# Patient Record
Sex: Male | Born: 1955
Health system: Southern US, Community
[De-identification: ages and names within clinical notes are randomized; demographics above are authoritative.]

## PROBLEM LIST (undated history)

## (undated) DIAGNOSIS — E7800 Pure hypercholesterolemia, unspecified: Secondary | ICD-10-CM

## (undated) DIAGNOSIS — G629 Polyneuropathy, unspecified: Secondary | ICD-10-CM

## (undated) DIAGNOSIS — N400 Enlarged prostate without lower urinary tract symptoms: Secondary | ICD-10-CM

## (undated) DIAGNOSIS — F419 Anxiety disorder, unspecified: Secondary | ICD-10-CM

## (undated) DIAGNOSIS — K219 Gastro-esophageal reflux disease without esophagitis: Secondary | ICD-10-CM

## (undated) DIAGNOSIS — N200 Calculus of kidney: Secondary | ICD-10-CM

## (undated) DIAGNOSIS — R7303 Prediabetes: Secondary | ICD-10-CM

## (undated) DIAGNOSIS — M47812 Spondylosis without myelopathy or radiculopathy, cervical region: Secondary | ICD-10-CM

## (undated) DIAGNOSIS — N529 Male erectile dysfunction, unspecified: Secondary | ICD-10-CM

## (undated) HISTORY — DX: Calculus of kidney: N20.0

## (undated) HISTORY — DX: Benign prostatic hyperplasia without lower urinary tract symptoms: N40.0

## (undated) HISTORY — DX: Spondylosis without myelopathy or radiculopathy, cervical region: M47.812

## (undated) HISTORY — PX: VASECTOMY: SHX75

## (undated) HISTORY — DX: Anxiety disorder, unspecified: F41.9

## (undated) HISTORY — DX: Pure hypercholesterolemia, unspecified: E78.00

## (undated) HISTORY — DX: Prediabetes: R73.03

## (undated) HISTORY — DX: Gastro-esophageal reflux disease without esophagitis: K21.9

## (undated) HISTORY — DX: Male erectile dysfunction, unspecified: N52.9

## (undated) HISTORY — PX: TONSILLECTOMY: SUR1361

## (undated) HISTORY — DX: Polyneuropathy, unspecified: G62.9

---

## 1999-12-19 ENCOUNTER — Ambulatory Visit (HOSPITAL_COMMUNITY): Admission: RE | Admit: 1999-12-19 | Discharge: 1999-12-19 | Payer: Self-pay | Admitting: Gastroenterology

## 2005-01-20 ENCOUNTER — Ambulatory Visit (HOSPITAL_COMMUNITY): Admission: RE | Admit: 2005-01-20 | Discharge: 2005-01-20 | Payer: Self-pay | Admitting: Gastroenterology

## 2007-01-15 ENCOUNTER — Emergency Department (HOSPITAL_COMMUNITY): Admission: EM | Admit: 2007-01-15 | Discharge: 2007-01-15 | Payer: Self-pay | Admitting: Emergency Medicine

## 2008-08-27 ENCOUNTER — Emergency Department (HOSPITAL_COMMUNITY): Admission: EM | Admit: 2008-08-27 | Discharge: 2008-08-27 | Payer: Self-pay | Admitting: Emergency Medicine

## 2010-02-15 ENCOUNTER — Emergency Department (HOSPITAL_COMMUNITY): Admission: EM | Admit: 2010-02-15 | Discharge: 2010-02-16 | Payer: Self-pay | Admitting: Emergency Medicine

## 2011-01-21 LAB — CBC
Hemoglobin: 13.8 g/dL (ref 13.0–17.0)
Platelets: 159 10*3/uL (ref 150–400)
RBC: 4.11 MIL/uL — ABNORMAL LOW (ref 4.22–5.81)
RDW: 13.2 % (ref 11.5–15.5)

## 2011-01-21 LAB — DIFFERENTIAL
Eosinophils Absolute: 0.1 10*3/uL (ref 0.0–0.7)
Eosinophils Relative: 1 % (ref 0–5)
Monocytes Relative: 8 % (ref 3–12)
Neutro Abs: 5.8 10*3/uL (ref 1.7–7.7)
Neutrophils Relative %: 72 % (ref 43–77)

## 2011-01-21 LAB — COMPREHENSIVE METABOLIC PANEL
BUN: 19 mg/dL (ref 6–23)
Calcium: 9.2 mg/dL (ref 8.4–10.5)
Creatinine, Ser: 1.11 mg/dL (ref 0.4–1.5)
Potassium: 4.6 mEq/L (ref 3.5–5.1)
Sodium: 142 mEq/L (ref 135–145)

## 2011-01-21 LAB — POCT CARDIAC MARKERS
CKMB, poc: 1.1 ng/mL (ref 1.0–8.0)
CKMB, poc: 2.1 ng/mL (ref 1.0–8.0)
Myoglobin, poc: 58.5 ng/mL (ref 12–200)
Myoglobin, poc: 68.8 ng/mL (ref 12–200)
Troponin i, poc: <0.05 ng/mL (ref 0.00–0.09)
Troponin i, poc: <0.05 ng/mL (ref 0.00–0.09)

## 2011-03-21 NOTE — Op Note (Signed)
NAMELIBERTY, STEAD NO.:  [id number]   MEDICAL RECORD NO.:  [id number]          PATIENT TYPE:  AMB   LOCATION:  ENDO                         FACILITY:  [facility]   PHYSICIAN:  James L. Malon Kindle., M.D.DATE OF BIRTH:  07-Sep-1956   DATE OF PROCEDURE:  01/20/2005  DATE OF DISCHARGE:                                 OPERATIVE REPORT   PROCEDURE:  Colonoscopy.   MEDICATIONS:  Fentanyl 62.5 mcg, Versed 6 mg IV.   SCOPE:  Olympus pediatric adjustable colonoscope.   INDICATIONS:  A strong family history of colon cancer, as mother had colon  cancer in her late 39s.   DESCRIPTION OF PROCEDURE:  The procedure has been explained to the patient  and consent obtained.  With the patient in the left lateral decubitus  position, the Olympus pediatric adjustable scope was inserted and advanced.  The prep was excellent.  I was able to reach the cecum without difficulty.  The ileocecal valve and appendiceal orifice seen.  The scope was withdrawn  in the cecum.  The ascending colon, transverse colon, splenic flexure,  descending and sigmoid colon were seen well.  No evidence of polyps.  No  significant diverticulosis.  The rectum was free of polyps as well.  The  patient tolerated the procedure well.   ASSESSMENT:  Family history of colon cancer with negative colonoscopy at  this time.  V76.51.   PLAN:  Will recommend yearly Hemoccults and repeat procedure in five years.      JLE/MEDQ  D:  01/20/2005  T:  01/20/2005  Job:  [id number]   cc:   Elana Alm. Nicholos Johns, M.D.  [address]  [place]  [place] [address]  Fax: [phone]

## 2011-08-05 LAB — URINALYSIS, ROUTINE W REFLEX MICROSCOPIC
Hgb urine dipstick: NEGATIVE
Ketones, ur: NEGATIVE
Protein, ur: NEGATIVE
Urobilinogen, UA: 0.2

## 2011-08-05 LAB — DIFFERENTIAL
Eosinophils Relative: 0
Lymphocytes Relative: 9 — ABNORMAL LOW
Monocytes Absolute: 1.2 — ABNORMAL HIGH
Neutrophils Relative %: 81 — ABNORMAL HIGH

## 2011-08-05 LAB — COMPREHENSIVE METABOLIC PANEL
ALT: 46
Albumin: 4.1
Alkaline Phosphatase: 43
Calcium: 9.2
Total Bilirubin: 1.1
Total Protein: 6.7

## 2011-08-05 LAB — CBC
Hemoglobin: 14.1
MCHC: 34.1
RBC: 4.36
RDW: 13.4
WBC: 12.9 — ABNORMAL HIGH

## 2012-05-19 ENCOUNTER — Encounter (HOSPITAL_COMMUNITY): Payer: Self-pay | Admitting: Emergency Medicine

## 2012-05-19 ENCOUNTER — Emergency Department (HOSPITAL_COMMUNITY)
Admission: EM | Admit: 2012-05-19 | Discharge: 2012-05-20 | Disposition: A | Discharge status: Home / Self Care | Payer: Managed Care, Other (non HMO) | Attending: Emergency Medicine | Admitting: Emergency Medicine

## 2012-05-19 DIAGNOSIS — Z87891 Personal history of nicotine dependence: Secondary | ICD-10-CM | POA: Insufficient documentation

## 2012-05-19 DIAGNOSIS — Y92009 Unspecified place in unspecified non-institutional (private) residence as the place of occurrence of the external cause: Secondary | ICD-10-CM | POA: Insufficient documentation

## 2012-05-19 DIAGNOSIS — E7800 Pure hypercholesterolemia, unspecified: Secondary | ICD-10-CM | POA: Insufficient documentation

## 2012-05-19 DIAGNOSIS — S61209A Unspecified open wound of unspecified finger without damage to nail, initial encounter: Secondary | ICD-10-CM | POA: Insufficient documentation

## 2012-05-19 DIAGNOSIS — W540XXA Bitten by dog, initial encounter: Secondary | ICD-10-CM | POA: Insufficient documentation

## 2012-05-19 HISTORY — DX: Pure hypercholesterolemia, unspecified: E78.00

## 2012-05-19 NOTE — ED Notes (Signed)
Pt states his own dog bit R hand, lacerations noted, bleeding controlled

## 2012-05-20 MED ORDER — AMOXICILLIN-POT CLAVULANATE 875-125 MG PO TABS: 1.0000 | ORAL_TABLET | Freq: Two times a day (BID) | ORAL | Status: AC

## 2012-05-20 MED ORDER — OXYCODONE-ACETAMINOPHEN 5-325 MG PO TABS: 1.0000 | ORAL_TABLET | Freq: Four times a day (QID) | ORAL | Status: AC | PRN

## 2012-05-20 MED ORDER — OXYCODONE-ACETAMINOPHEN 5-325 MG PO TABS
2.0000 | ORAL_TABLET | Freq: Once | ORAL | Status: AC
  Administered 2012-05-20: 2 via ORAL
  Filled 2012-05-20: qty 2

## 2012-05-20 NOTE — ED Provider Notes (Signed)
History     CSN: [id number]  Arrival date & time 05/19/12  2108   First MD Initiated Contact with Patient 05/19/12 2340      Chief Complaint  Patient presents with  . Animal Bite    pt's own dog to RT hand/fingers    (Consider location/radiation/quality/duration/timing/severity/associated sxs/prior treatment) HPI Comments: Patient bitten by his own dog prior to arrival.  All of the dogs immunizations are UTD.  Patient is a 56 y.o. male presenting with animal bite. The history is provided by the patient.  Animal Bite  The incident occurred just prior to arrival. The incident occurred at home. There is an injury to the right long finger and right ring finger. The pain is moderate. It is unlikely that a foreign body is present. Pertinent negatives include no numbness, no nausea, no vomiting, no inability to bear weight, no tingling and no weakness. There have been no prior injuries to these areas. He is right-handed. His tetanus status is UTD.    Past Medical History  Diagnosis Date  . Hypercholesteremia     Past Surgical History  Procedure Date  . Tonsillectomy   . Vasectomy     No family history on file.  History  Substance Use Topics  . Smoking status: Former Games developer  . Smokeless tobacco: Not on file  . Alcohol Use: Yes     occasional      Review of Systems  Constitutional: Negative for fever and chills.  Gastrointestinal: Negative for nausea and vomiting.  Musculoskeletal: Negative for joint swelling and gait problem.  Skin: Positive for wound.  Neurological: Negative for tingling, weakness and numbness.  All other systems reviewed and are negative.    Allergies  Review of patient's allergies indicates no known allergies.  Home Medications  No current outpatient prescriptions on file.  BP 127/74  Pulse 64  Temp 98.7 F (37.1 C) (Oral)  Resp 20  Wt 185 lb (83.915 kg)  SpO2 96%  Physical Exam  Nursing note and vitals reviewed. Constitutional: He  appears well-developed and well-nourished. No distress.  HENT:  Head: Normocephalic and atraumatic.  Mouth/Throat: Oropharynx is clear and moist.  Neck: Normal range of motion. Neck supple.  Cardiovascular: Normal rate, regular rhythm and normal heart sounds.   Pulses:      Radial pulses are 2+ on the right side, and 2+ on the left side.  Pulmonary/Chest: Effort normal and breath sounds normal.  Musculoskeletal:       Patient with full ROM of the right 3rd digit DIP and PIP.  No apparent tendon damage.  Neurological: He is alert. No sensory deficit. Gait normal.  Skin: He is not diaphoretic.       1.5 cm laceration to the right 3rd digit the palmar surface with surrounding edema.  Small superficial laceration to the palmar surface of the right 4th digit.    Psychiatric: He has a normal mood and affect.    ED Course  Procedures (including critical care time)  Labs Reviewed - No data to display No results found.   1. Dog bite     Patient discussed with Dr. Norlene Campbell who also evaluated the patient.    MDM  Patient presenting with dog bite to his right hand.  Dog known to him and all dogs immunizations are UTD.  Patient's tetanus UTD.  Patient neurovascularly intact.  Laceration irrigated well and patient discharged home with pain medication and Augmentin.  Patient given follow up with Hand Surgery.  Pascal Lux Dimmitt, PA-C 05/22/12 [phone]

## 2012-05-20 NOTE — ED Notes (Signed)
MD at bedside. 

## 2012-05-20 NOTE — ED Notes (Signed)
Dispatch bite form faxed to [employer]

## 2012-05-20 NOTE — ED Notes (Signed)
Wounds wrapped with sof-form gauze bandage per pt preference

## 2012-05-25 NOTE — ED Provider Notes (Signed)
Medical screening examination/treatment/procedure(s) were conducted as a shared visit with non-physician practitioner(s) and myself.  I personally evaluated the patient during the encounter.  Pt with dog bite to fingers, his own dog, vaccinated.  No tendon involvement appreciated on my exam, normal ROM, no tendon visualized.  Wounds left open given concern for infection, irrigated well, bandaged.  Pt to f/u with hand surgery  Olivia Mackie, MD 05/25/12 [phone]

## 2015-11-14 ENCOUNTER — Encounter: Payer: Self-pay | Admitting: Podiatry

## 2015-11-14 ENCOUNTER — Ambulatory Visit (INDEPENDENT_AMBULATORY_CARE_PROVIDER_SITE_OTHER): Payer: 59 | Admitting: Podiatry

## 2015-11-14 DIAGNOSIS — M2042 Other hammer toe(s) (acquired), left foot: Secondary | ICD-10-CM

## 2015-11-14 DIAGNOSIS — L84 Corns and callosities: Secondary | ICD-10-CM | POA: Diagnosis not present

## 2015-11-14 NOTE — Progress Notes (Signed)
Subjective:     Patient ID: Tony NakaiRodolfo Lipe, male   DOB: 01/29/1956, 60 y.o.   MRN: [id number]  HPI this patient presents the office with chief complaint of a painful corn on the fifth toe, left foot. He states that this corn is painful as he walks and wears his shoes. He says he has lived with this, but he is going on a [place] of [place] in [place] and expects to walk a lot. He has provided no self treatment nor sought any professional help. He presents the office for an evaluation and treatment of this fifth toe, left foot   Review of Systems     Objective:   Physical Exam GENERAL APPEARANCE: Alert, conversant. Appropriately groomed. No acute distress.  VASCULAR: Pedal pulses palpable at  [facility].  Capillary refill time is immediate to all digits,  Normal temperature gradient.  Digital hair growth is present bilateral  NEUROLOGIC: sensation is normal to 5.07 monofilament at 5/5 sites bilateral.  Light touch is intact bilateral, Muscle strength normal.  MUSCULOSKELETAL: acceptable muscle strength, tone and stability bilateral.  Intrinsic muscluature intact bilateral.  Rectus appearance of foot and digits noted bilateral. Adducto-varus fifth digits B/L with left worse than right.  DERMATOLOGIC: skin color, texture, and turgor are within normal limits.  No preulcerative lesions or ulcers  are seen, no interdigital maceration noted.  No open lesions present.  Digital nails are asymptomatic. No drainage noted. Heloma durum fifth toe left foot.      Assessment:     Hammer toe left foot    Plan:   IE  Debride heloma durum fifth left foot.  Padding dispensed  RTC prn   Helane GuntherGregory Ellenor Wisniewski DPM

## 2015-11-14 NOTE — Addendum Note (Signed)
Addended by: Harlon FlorSOUTHERLAND, Pairlee Sawtell L on: 11/14/2015 02:23 PM   Modules accepted: Medications

## 2016-12-03 DIAGNOSIS — Z209 Contact with and (suspected) exposure to unspecified communicable disease: Secondary | ICD-10-CM | POA: Diagnosis not present

## 2016-12-09 DIAGNOSIS — Z0000 Encounter for general adult medical examination without abnormal findings: Secondary | ICD-10-CM | POA: Diagnosis not present

## 2016-12-09 DIAGNOSIS — R7303 Prediabetes: Secondary | ICD-10-CM | POA: Diagnosis not present

## 2016-12-09 DIAGNOSIS — E782 Mixed hyperlipidemia: Secondary | ICD-10-CM | POA: Diagnosis not present

## 2016-12-09 DIAGNOSIS — M5030 Other cervical disc degeneration, unspecified cervical region: Secondary | ICD-10-CM | POA: Diagnosis not present

## 2017-01-12 DIAGNOSIS — E782 Mixed hyperlipidemia: Secondary | ICD-10-CM | POA: Diagnosis not present

## 2017-01-12 DIAGNOSIS — R7303 Prediabetes: Secondary | ICD-10-CM | POA: Diagnosis not present

## 2017-01-12 DIAGNOSIS — I10 Essential (primary) hypertension: Secondary | ICD-10-CM | POA: Diagnosis not present

## 2017-01-16 DIAGNOSIS — Z8249 Family history of ischemic heart disease and other diseases of the circulatory system: Secondary | ICD-10-CM | POA: Diagnosis not present

## 2017-01-16 DIAGNOSIS — R0602 Shortness of breath: Secondary | ICD-10-CM | POA: Diagnosis not present

## 2017-01-16 DIAGNOSIS — I10 Essential (primary) hypertension: Secondary | ICD-10-CM | POA: Diagnosis not present

## 2017-01-19 DIAGNOSIS — Z23 Encounter for immunization: Secondary | ICD-10-CM | POA: Diagnosis not present

## 2017-01-19 DIAGNOSIS — R0600 Dyspnea, unspecified: Secondary | ICD-10-CM | POA: Diagnosis not present

## 2017-01-21 ENCOUNTER — Other Ambulatory Visit: Payer: Self-pay | Admitting: Cardiology

## 2017-01-21 DIAGNOSIS — R0602 Shortness of breath: Secondary | ICD-10-CM

## 2017-01-27 ENCOUNTER — Ambulatory Visit
Admission: RE | Admit: 2017-01-27 | Discharge: 2017-01-27 | Disposition: A | Discharge status: Home / Self Care | Payer: No Typology Code available for payment source | Source: Ambulatory Visit | Attending: Cardiology | Admitting: Cardiology

## 2017-01-27 DIAGNOSIS — R0602 Shortness of breath: Secondary | ICD-10-CM

## 2017-02-04 DIAGNOSIS — I2510 Atherosclerotic heart disease of native coronary artery without angina pectoris: Secondary | ICD-10-CM | POA: Diagnosis not present

## 2017-05-04 DIAGNOSIS — K648 Other hemorrhoids: Secondary | ICD-10-CM | POA: Diagnosis not present

## 2017-05-04 DIAGNOSIS — L237 Allergic contact dermatitis due to plants, except food: Secondary | ICD-10-CM | POA: Diagnosis not present

## 2017-06-08 DIAGNOSIS — E782 Mixed hyperlipidemia: Secondary | ICD-10-CM | POA: Diagnosis not present

## 2017-06-08 DIAGNOSIS — I10 Essential (primary) hypertension: Secondary | ICD-10-CM | POA: Diagnosis not present

## 2017-06-08 DIAGNOSIS — R7303 Prediabetes: Secondary | ICD-10-CM | POA: Diagnosis not present

## 2017-07-27 DIAGNOSIS — Z23 Encounter for immunization: Secondary | ICD-10-CM | POA: Diagnosis not present

## 2017-11-27 DIAGNOSIS — B9789 Other viral agents as the cause of diseases classified elsewhere: Secondary | ICD-10-CM | POA: Diagnosis not present

## 2017-11-27 DIAGNOSIS — J069 Acute upper respiratory infection, unspecified: Secondary | ICD-10-CM | POA: Diagnosis not present

## 2017-12-10 DIAGNOSIS — I10 Essential (primary) hypertension: Secondary | ICD-10-CM | POA: Diagnosis not present

## 2017-12-10 DIAGNOSIS — E782 Mixed hyperlipidemia: Secondary | ICD-10-CM | POA: Diagnosis not present

## 2017-12-10 DIAGNOSIS — Z0000 Encounter for general adult medical examination without abnormal findings: Secondary | ICD-10-CM | POA: Diagnosis not present

## 2017-12-10 DIAGNOSIS — R7303 Prediabetes: Secondary | ICD-10-CM | POA: Diagnosis not present

## 2017-12-23 IMAGING — CT CT HEART SCORING
1 of 2 series · 11 of 20 positions shown, 14 images · non-contrast
Comparison: None.

CLINICAL DATA: Shortness of breath.

EXAM:
CT HEART FOR CALCIUM SCORING
TECHNIQUE: CT heart was performed on a 64 channel system using prospective ECG
gating.
A non-contrast exam for calcium scoring was performed.
Note that this exam targets the heart and the chest was not imaged
in its entirety.

[Series 2: smartscore - gated 0.4 sec · axial · 0.44mm/px · z∈[-190,-74]mm · 11 of 56 slices shown, 14 images]
[im 5/56  vessel]
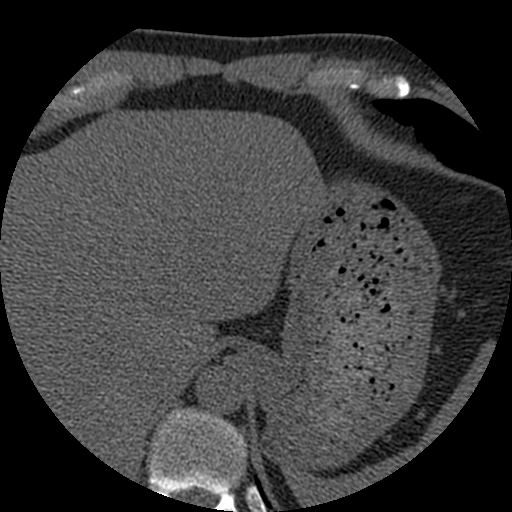
[im 5/56  lung]
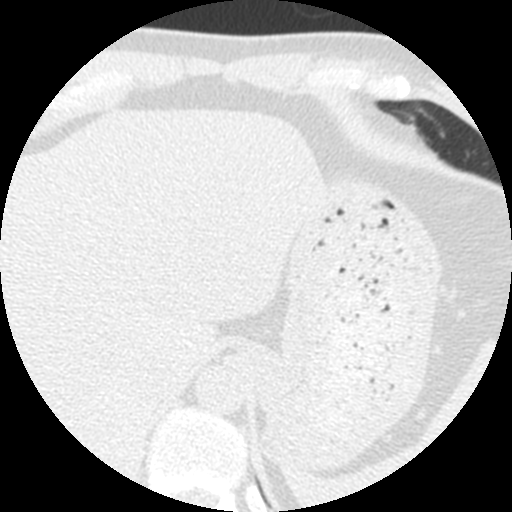
[im 10/56  vessel]
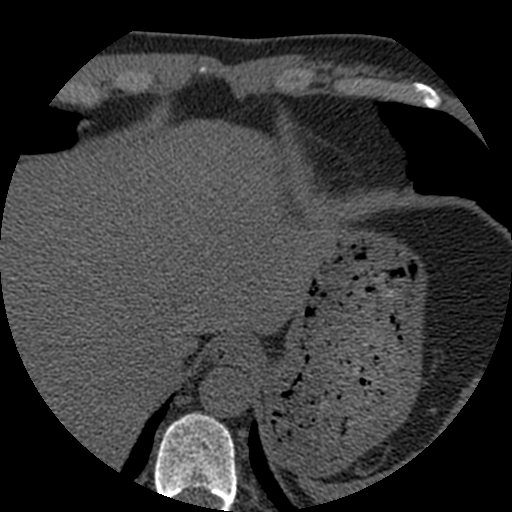
[im 14/56  vessel]
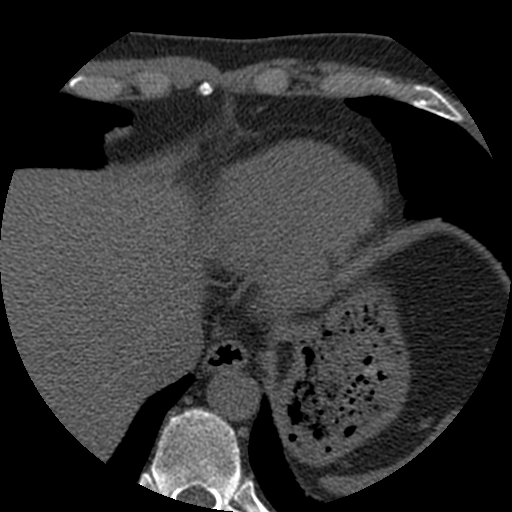
[im 19/56  vessel]
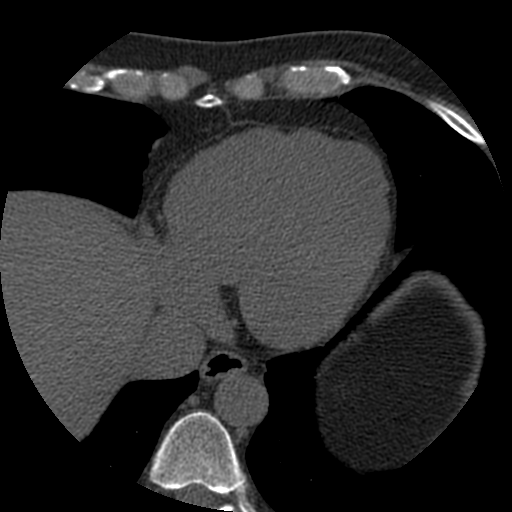
[im 23/56  vessel]
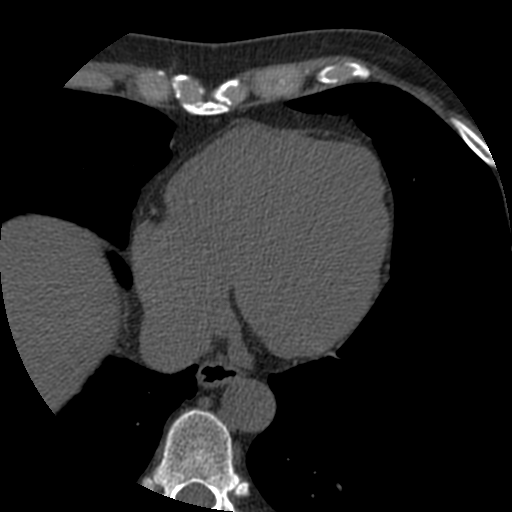
[im 23/56  lung]
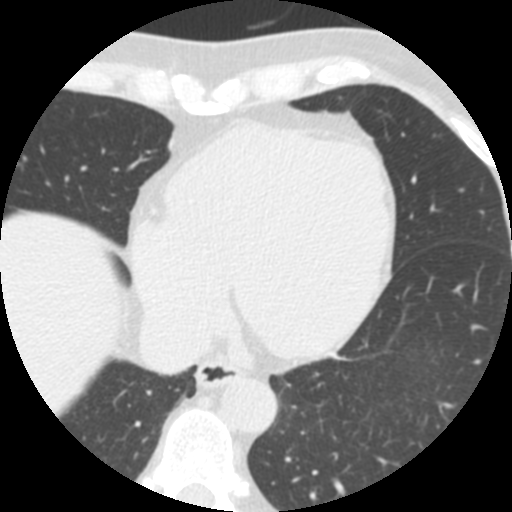
[im 28/56  vessel]
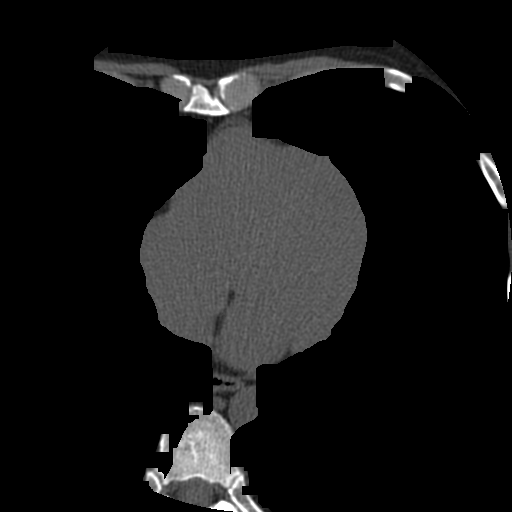
[im 33/56  vessel]
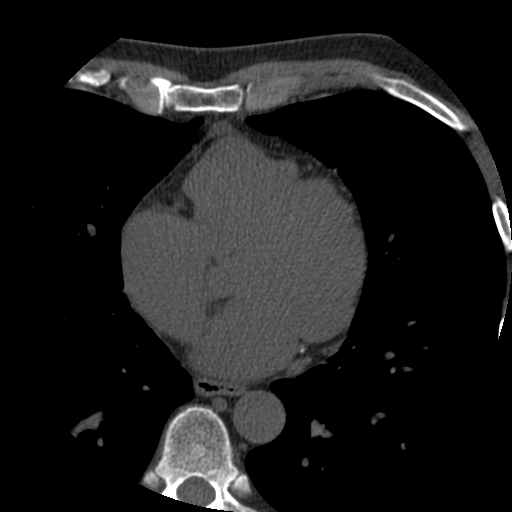
[im 37/56  vessel]
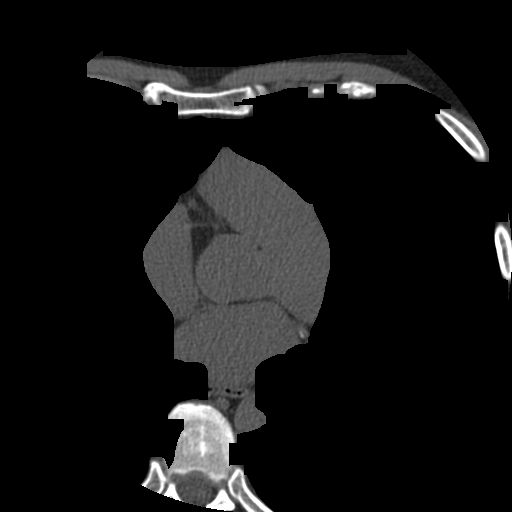
[im 42/56  vessel]
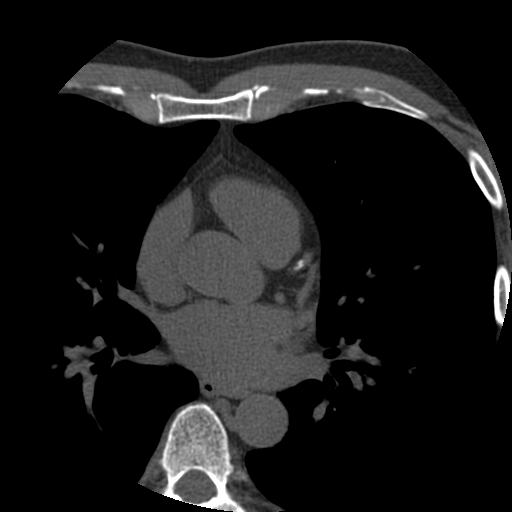
[im 42/56  lung]
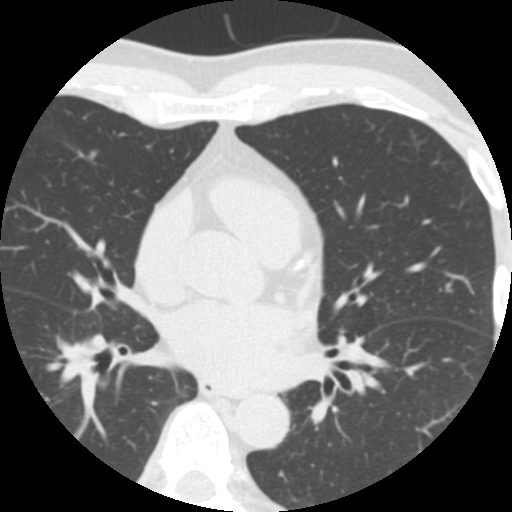
[im 46/56  vessel]
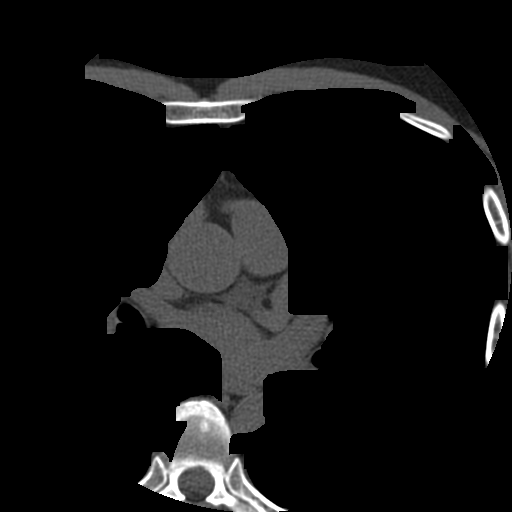
[im 51/56  vessel]
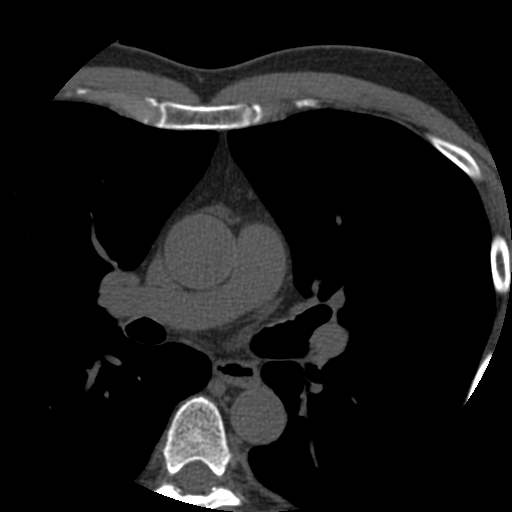

[11 of 20 positions shown; findings below may reference images not displayed]

FINDINGS: Technical quality: Good.

CORONARY CALCIUM

Total Agatston Score: 108. Calcium identified within the left main,
lad, right coronary, and left circumflex.

[HOSPITAL] percentile:  62

OTHER FINDINGS:

Cardiovascular: Normal aortic caliber. Normal heart size, without
pericardial effusion.

Mediastinum/Nodes: No imaged thoracic adenopathy.

Lungs/Pleura: No pleural fluid. 3 mm subpleural left lower lobe
pulmonary nodule on image 12/series 4.

Upper Abdomen: Normal imaged portions of the liver, spleen, stomach.

Musculoskeletal: No acute osseous abnormality.
IMPRESSION: 1. Total Agatston score of 108, corresponding to 62nd percentile for
age, sex, and race based cohort.
2. No acute findings in the imaged chest.
3. 3 mm left lower lobe pulmonary nodule. No follow-up needed if
patient is low-risk. Non-contrast chest CT can be considered in 12
months if patient is high-risk. This recommendation follows the
consensus statement: Guidelines for Management of Incidental
Pulmonary Nodules Detected on CT Images: From the [HOSPITAL]

## 2017-12-29 DIAGNOSIS — Z1283 Encounter for screening for malignant neoplasm of skin: Secondary | ICD-10-CM | POA: Diagnosis not present

## 2017-12-29 DIAGNOSIS — L821 Other seborrheic keratosis: Secondary | ICD-10-CM | POA: Diagnosis not present

## 2017-12-29 DIAGNOSIS — L820 Inflamed seborrheic keratosis: Secondary | ICD-10-CM | POA: Diagnosis not present

## 2018-05-10 DIAGNOSIS — H5213 Myopia, bilateral: Secondary | ICD-10-CM | POA: Diagnosis not present

## 2018-05-10 DIAGNOSIS — H524 Presbyopia: Secondary | ICD-10-CM | POA: Diagnosis not present

## 2018-05-10 DIAGNOSIS — H52223 Regular astigmatism, bilateral: Secondary | ICD-10-CM | POA: Diagnosis not present

## 2018-06-10 DIAGNOSIS — R252 Cramp and spasm: Secondary | ICD-10-CM | POA: Diagnosis not present

## 2018-06-10 DIAGNOSIS — E782 Mixed hyperlipidemia: Secondary | ICD-10-CM | POA: Diagnosis not present

## 2018-06-10 DIAGNOSIS — I10 Essential (primary) hypertension: Secondary | ICD-10-CM | POA: Diagnosis not present

## 2018-06-10 DIAGNOSIS — R7303 Prediabetes: Secondary | ICD-10-CM | POA: Diagnosis not present

## 2018-07-26 DIAGNOSIS — Z23 Encounter for immunization: Secondary | ICD-10-CM | POA: Diagnosis not present

## 2018-12-24 DIAGNOSIS — R7303 Prediabetes: Secondary | ICD-10-CM | POA: Diagnosis not present

## 2018-12-24 DIAGNOSIS — N529 Male erectile dysfunction, unspecified: Secondary | ICD-10-CM | POA: Diagnosis not present

## 2018-12-24 DIAGNOSIS — M5030 Other cervical disc degeneration, unspecified cervical region: Secondary | ICD-10-CM | POA: Diagnosis not present

## 2018-12-24 DIAGNOSIS — E782 Mixed hyperlipidemia: Secondary | ICD-10-CM | POA: Diagnosis not present

## 2018-12-24 DIAGNOSIS — Z0000 Encounter for general adult medical examination without abnormal findings: Secondary | ICD-10-CM | POA: Diagnosis not present

## 2018-12-24 DIAGNOSIS — Z125 Encounter for screening for malignant neoplasm of prostate: Secondary | ICD-10-CM | POA: Diagnosis not present

## 2018-12-24 DIAGNOSIS — Z23 Encounter for immunization: Secondary | ICD-10-CM | POA: Diagnosis not present

## 2020-02-02 ENCOUNTER — Other Ambulatory Visit: Payer: Self-pay | Admitting: Family Medicine

## 2020-02-02 DIAGNOSIS — Z136 Encounter for screening for cardiovascular disorders: Secondary | ICD-10-CM

## 2020-02-09 ENCOUNTER — Ambulatory Visit: Payer: Self-pay

## 2020-02-09 ENCOUNTER — Ambulatory Visit
Admission: RE | Admit: 2020-02-09 | Discharge: 2020-02-09 | Disposition: A | Discharge status: Home / Self Care | Payer: Medicare Other | Source: Ambulatory Visit | Attending: Family Medicine | Admitting: Family Medicine

## 2020-02-09 DIAGNOSIS — Z136 Encounter for screening for cardiovascular disorders: Secondary | ICD-10-CM

## 2021-01-30 ENCOUNTER — Ambulatory Visit: Payer: Self-pay | Admitting: Orthopedic Surgery

## 2021-02-19 ENCOUNTER — Other Ambulatory Visit (HOSPITAL_COMMUNITY): Payer: 59

## 2021-02-21 ENCOUNTER — Ambulatory Visit (HOSPITAL_COMMUNITY): Admission: RE | Admit: 2021-02-21 | Payer: Medicare Other | Source: Home / Self Care | Admitting: Specialist

## 2021-02-21 ENCOUNTER — Encounter (HOSPITAL_COMMUNITY): Admission: RE | Payer: Self-pay | Source: Home / Self Care

## 2021-02-21 SURGERY — LUMBAR LAMINECTOMY/DECOMPRESSION MICRODISCECTOMY 1 LEVEL
Anesthesia: General | Laterality: Right

## 2021-03-27 ENCOUNTER — Ambulatory Visit: Payer: Self-pay | Admitting: Orthopedic Surgery

## 2021-05-16 ENCOUNTER — Ambulatory Visit: Payer: Self-pay | Admitting: Orthopedic Surgery

## 2021-05-16 NOTE — H&P (View-Only) (Signed)
Tony Dominguez is an 65 y.o. male.   Chief Complaint: back and R leg pain HPI: Reason for Visit: (normal) visit for: (back) Location (Lower Extremity): leg pain on the right, , Severity: pain level 2/10 Aggravating Factors: sitting for Alleviating Factors: PT/OT Associated Symptoms: numbness/tingling (RLE) Medications: helping a little; Naproxen and Tylenol prn Still having right lower extremity radicular pain L5 nerve root distribution. Taking naproxen and Tylenol.  Past Medical History:  Diagnosis Date   Hypercholesteremia     Past Surgical History:  Procedure Laterality Date   TONSILLECTOMY     VASECTOMY      No family history on file. Social History:  reports that he has quit smoking. He does not have any smokeless tobacco history on file. He reports current alcohol use. He reports that he does not use drugs.  Allergies: No Known Allergies  Medications: candesartan 16 mg-hydrochlorothiazide 12.5 mg tablet naproxen 500 mg tablet predniSONE 5 mg tablet rosuvastatin 20 mg tablet sildenafil (pulmonary hypertension) 20 mg tablet  Review of Systems  Constitutional: Negative.   HENT: Negative.    Eyes: Negative.   Respiratory: Negative.    Cardiovascular: Negative.   Gastrointestinal: Negative.   Genitourinary: Negative.   Musculoskeletal:  Positive for back pain.  Skin: Negative.   Neurological:  Positive for weakness and numbness.   There were no vitals taken for this visit. Physical Exam Constitutional:      Appearance: Normal appearance.  HENT:     Head: Normocephalic.     Right Ear: External ear normal.     Left Ear: External ear normal.     Nose: Nose normal.     Mouth/Throat:     Mouth: Mucous membranes are moist.  Eyes:     Conjunctiva/sclera: Conjunctivae normal.  Cardiovascular:     Rate and Rhythm: Normal rate and regular rhythm.     Pulses: Normal pulses.  Pulmonary:     Effort: Pulmonary effort is normal.  Abdominal:     General: Bowel sounds  are normal.  Musculoskeletal:     Cervical back: Normal range of motion.     Comments: Straight leg raising right is buttock thigh and calf pain negative on left perhaps trace EHL weakness in the right compared to the left. Tender right proximal gluteus.  Skin:    General: Skin is warm and dry.  Neurological:     Mental Status: He is alert.    MRI demonstrates facet arthropathy and a synovial cyst L5-S1 on the right extending into the L5 foramen and is facing the S1 nerve root  Assessment/Plan Impression:  Continued L5-S1 radiculopathy secondary to synovial cyst and lateral recess stenosis L5-S1 on the right.  Refractory to rest, activity modification. Steroid Dosepak and physical therapy.  Plan:  We discussed options completing his physical therapy  Avoiding provocative activity  Patient is expressed the desire to proceed with surgical decompression and removal of the cyst.  I had an extensive discussion with the patient concerning the pathology relevant anatomy and treatment options. At this point exhausting conservative treatment and in the presence of a neurologic deficit we discussed microlumbar decompression. I discussed the risks and benefits including bleeding, infection, DVT, PE, anesthetic complications, worsening in their symptoms, improvement in their symptoms, C SF leakage, epidural fibrosis, need for future surgeries such as revision discectomy and lumbar fusion. I also indicated that this is an operation to basically decompress the nerve root to allow recovery. We discussed the possibility of recurrence of the cyst requiring  repeat removal or possible fusion.  I discussed the operative course including overnight in the hospital. Immediate ambulation. Follow-up in 2 weeks for suture removal. 6 weeks until healing of the herniation followed by 6 weeks of reconditioning and strengthening of the core musculature. Also discussed the need to employ the concepts of disc pressure  management and core motion following the surgery to minimize the risk of recurrent disc herniation. We will obtain preoperative clearance i if necessary and proceed accordingly.  In the interim he is going on a trip. We will place him on a steroid Dosepak. He is got 1 from the prescription previously he will utilize that.  Other option is to live with his symptoms as they are.  If there is any changes in his symptoms in the meantime he is to call.  Plan microlumbar decompression L5-S1 right with excision of synovial cyst  Dorothy Spark, PA-C for Dr Shelle Iron 05/16/2021, 2:33 PM

## 2021-05-16 NOTE — H&P (Signed)
Tony Dominguez is an 65 y.o. male.   Chief Complaint: back and R leg pain HPI: Reason for Visit: (normal) visit for: (back) Location (Lower Extremity): leg pain on the right, , Severity: pain level 2/10 Aggravating Factors: sitting for Alleviating Factors: PT/OT Associated Symptoms: numbness/tingling (RLE) Medications: helping a little; Naproxen and Tylenol prn Still having right lower extremity radicular pain L5 nerve root distribution. Taking naproxen and Tylenol.  Past Medical History:  Diagnosis Date   Hypercholesteremia     Past Surgical History:  Procedure Laterality Date   TONSILLECTOMY     VASECTOMY      No family history on file. Social History:  reports that he has quit smoking. He does not have any smokeless tobacco history on file. He reports current alcohol use. He reports that he does not use drugs.  Allergies: No Known Allergies  Medications: candesartan 16 mg-hydrochlorothiazide 12.5 mg tablet naproxen 500 mg tablet predniSONE 5 mg tablet rosuvastatin 20 mg tablet sildenafil (pulmonary hypertension) 20 mg tablet  Review of Systems  Constitutional: Negative.   HENT: Negative.    Eyes: Negative.   Respiratory: Negative.    Cardiovascular: Negative.   Gastrointestinal: Negative.   Genitourinary: Negative.   Musculoskeletal:  Positive for back pain.  Skin: Negative.   Neurological:  Positive for weakness and numbness.   There were no vitals taken for this visit. Physical Exam Constitutional:      Appearance: Normal appearance.  HENT:     Head: Normocephalic.     Right Ear: External ear normal.     Left Ear: External ear normal.     Nose: Nose normal.     Mouth/Throat:     Mouth: Mucous membranes are moist.  Eyes:     Conjunctiva/sclera: Conjunctivae normal.  Cardiovascular:     Rate and Rhythm: Normal rate and regular rhythm.     Pulses: Normal pulses.  Pulmonary:     Effort: Pulmonary effort is normal.  Abdominal:     General: Bowel sounds  are normal.  Musculoskeletal:     Cervical back: Normal range of motion.     Comments: Straight leg raising right is buttock thigh and calf pain negative on left perhaps trace EHL weakness in the right compared to the left. Tender right proximal gluteus.  Skin:    General: Skin is warm and dry.  Neurological:     Mental Status: He is alert.    MRI demonstrates facet arthropathy and a synovial cyst L5-S1 on the right extending into the L5 foramen and is facing the S1 nerve root  Assessment/Plan Impression:  Continued L5-S1 radiculopathy secondary to synovial cyst and lateral recess stenosis L5-S1 on the right.  Refractory to rest, activity modification. Steroid Dosepak and physical therapy.  Plan:  We discussed options completing his physical therapy  Avoiding provocative activity  Patient is expressed the desire to proceed with surgical decompression and removal of the cyst.  I had an extensive discussion with the patient concerning the pathology relevant anatomy and treatment options. At this point exhausting conservative treatment and in the presence of a neurologic deficit we discussed microlumbar decompression. I discussed the risks and benefits including bleeding, infection, DVT, PE, anesthetic complications, worsening in their symptoms, improvement in their symptoms, C SF leakage, epidural fibrosis, need for future surgeries such as revision discectomy and lumbar fusion. I also indicated that this is an operation to basically decompress the nerve root to allow recovery. We discussed the possibility of recurrence of the cyst requiring   repeat removal or possible fusion.  I discussed the operative course including overnight in the hospital. Immediate ambulation. Follow-up in 2 weeks for suture removal. 6 weeks until healing of the herniation followed by 6 weeks of reconditioning and strengthening of the core musculature. Also discussed the need to employ the concepts of disc pressure  management and core motion following the surgery to minimize the risk of recurrent disc herniation. We will obtain preoperative clearance i if necessary and proceed accordingly.  In the interim he is going on a trip. We will place him on a steroid Dosepak. He is got 1 from the prescription previously he will utilize that.  Other option is to live with his symptoms as they are.  If there is any changes in his symptoms in the meantime he is to call.  Plan microlumbar decompression L5-S1 right with excision of synovial cyst  Dorothy Spark, PA-C for Dr Shelle Iron 05/16/2021, 2:33 PM

## 2021-05-17 NOTE — Progress Notes (Signed)
Surgical Instructions    Your procedure is scheduled on 05/23/21.  Report to [facility] Main Entrance "A" at 05:30 A.M., then check in with the Admitting office.  Call this number if you have problems the morning of surgery:  [phone]   If you have any questions prior to your surgery date call [phone]: Open Monday-Friday 8am-4pm    Remember:  Do not eat after midnight the night before your surgery  You may drink clear liquids until 04:30am  the morning of your surgery.   Clear liquids allowed are: Water, Non-Citrus Juices (without pulp), Carbonated Beverages, Clear Tea, Black Coffee Only, and Gatorade  Patient Instructions  The night before surgery:  No food after midnight. ONLY clear liquids after midnight  The day of surgery (if you do NOT have diabetes):  Drink ONE (1) Pre-Surgery Clear Ensure by 04:30am the morning of surgery. Drink in one sitting. Do not sip.  This drink was given to you during your hospital  pre-op appointment visit. Nothing else to drink after completing the  Pre-Surgery Clear Ensure.           If you have questions, please contact your surgeon's office.     Take these medicines the morning of surgery with A SIP OF WATER  rosuvastatin (CRESTOR)     As of today, STOP taking any Aspirin (unless otherwise instructed by your surgeon) Aleve, Naproxen, Ibuprofen, Motrin, Advil, Goody's, BC's, all herbal medications, fish oil, and all vitamins.          Do not wear jewelry or makeup Do not wear lotions, powders, colognes, or deodorant. Men may shave face and neck. Do not bring valuables to the hospital.  DO Not wear nail polish, gel polish, artificial nails, or any other type of covering on natural nails  including finger and toenails. If patients have artificial nails, gel coating, etc. that need to be removed by a nail salon please have this removed prior to surgery or surgery may need to be canceled/delayed if the surgeon/ anesthesia feels  like the patient is unable to be adequately monitored.             [facility] is not responsible for any belongings or valuables.  Do NOT Smoke (Tobacco/Vaping) or drink Alcohol 24 hours prior to your procedure If you use a CPAP at night, you may bring all equipment for your overnight stay.   Contacts, glasses, dentures or bridgework may not be worn into surgery, please bring cases for these belongings   For patients admitted to the hospital, discharge time will be determined by your treatment team.   Patients discharged the day of surgery will not be allowed to drive home, and someone needs to stay with them for 24 hours.  ONLY 1 SUPPORT PERSON MAY BE PRESENT WHILE YOU ARE IN SURGERY. IF YOU ARE TO BE ADMITTED ONCE YOU ARE IN YOUR ROOM YOU WILL BE ALLOWED TWO (2) VISITORS.  Minor children may have two parents present. Special consideration for safety and communication needs will be reviewed on a case by case basis.  Special instructions:    Oral Hygiene is also important to reduce your risk of infection.  Remember - BRUSH YOUR TEETH THE MORNING OF SURGERY WITH YOUR REGULAR TOOTHPASTE   [facility]- Preparing For Surgery  Before surgery, you can play an important role. Because skin is not sterile, your skin needs to be as free of germs as possible. You can reduce the number of germs on your  skin by washing with CHG (chlorahexidine gluconate) Soap before surgery.  CHG is an antiseptic cleaner which kills germs and bonds with the skin to continue killing germs even after washing.     Please do not use if you have an allergy to CHG or antibacterial soaps. If your skin becomes reddened/irritated stop using the CHG.  Do not shave (including legs and underarms) for at least 48 hours prior to first CHG shower. It is OK to shave your face.  Please follow these instructions carefully.     Shower the NIGHT BEFORE SURGERY and the MORNING OF SURGERY with CHG Soap.   If you chose to wash your  hair, wash your hair first as usual with your normal shampoo. After you shampoo, rinse your hair and body thoroughly to remove the shampoo.  Then [employer] and genitals (private parts) with your normal soap and rinse thoroughly to remove soap.  After that Use CHG Soap as you would any other liquid soap. You can apply CHG directly to the skin and wash gently with a scrungie or a clean washcloth.   Apply the CHG Soap to your body ONLY FROM THE NECK DOWN.  Do not use on open wounds or open sores. Avoid contact with your eyes, ears, mouth and genitals (private parts). Wash Face and genitals (private parts)  with your normal soap.   Wash thoroughly, paying special attention to the area where your surgery will be performed.  Thoroughly rinse your body with warm water from the neck down.  DO NOT shower/wash with your normal soap after using and rinsing off the CHG Soap.  Pat yourself dry with a CLEAN TOWEL.  Wear CLEAN PAJAMAS to bed the night before surgery  Place CLEAN SHEETS on your bed the night before your surgery  DO NOT SLEEP WITH PETS.   Day of Surgery: Take a shower with CHG soap. Wear Clean/Comfortable clothing the morning of surgery Do not apply any deodorants/lotions.   Remember to brush your teeth WITH YOUR REGULAR TOOTHPASTE.   Please read over the following fact sheets that you were given.

## 2021-05-20 ENCOUNTER — Other Ambulatory Visit: Payer: Self-pay

## 2021-05-20 ENCOUNTER — Encounter (HOSPITAL_COMMUNITY)
Admission: RE | Admit: 2021-05-20 | Discharge: 2021-05-20 | Disposition: A | Discharge status: Home / Self Care | Payer: Medicare Other | Source: Ambulatory Visit | Attending: Specialist | Admitting: Specialist

## 2021-05-20 ENCOUNTER — Ambulatory Visit (HOSPITAL_COMMUNITY)
Admission: RE | Admit: 2021-05-20 | Discharge: 2021-05-20 | Disposition: A | Discharge status: Home / Self Care | Payer: Medicare Other | Source: Ambulatory Visit | Attending: Orthopedic Surgery | Admitting: Orthopedic Surgery

## 2021-05-20 ENCOUNTER — Encounter (HOSPITAL_COMMUNITY): Payer: Self-pay

## 2021-05-20 DIAGNOSIS — M47819 Spondylosis without myelopathy or radiculopathy, site unspecified: Secondary | ICD-10-CM | POA: Diagnosis not present

## 2021-05-20 DIAGNOSIS — M5136 Other intervertebral disc degeneration, lumbar region: Secondary | ICD-10-CM | POA: Diagnosis not present

## 2021-05-20 DIAGNOSIS — Z01812 Encounter for preprocedural laboratory examination: Secondary | ICD-10-CM | POA: Insufficient documentation

## 2021-05-20 DIAGNOSIS — M5126 Other intervertebral disc displacement, lumbar region: Secondary | ICD-10-CM | POA: Diagnosis not present

## 2021-05-20 DIAGNOSIS — Z20822 Contact with and (suspected) exposure to covid-19: Secondary | ICD-10-CM | POA: Insufficient documentation

## 2021-05-20 LAB — CBC
HCT: 43.1 % (ref 39.0–52.0)
Hemoglobin: 14.6 g/dL (ref 13.0–17.0)
MCH: 31.6 pg (ref 26.0–34.0)
MCHC: 33.9 g/dL (ref 30.0–36.0)
MCV: 93.3 fL (ref 80.0–100.0)
Platelets: 176 10*3/uL (ref 150–400)
RBC: 4.62 MIL/uL (ref 4.22–5.81)
RDW: 13 % (ref 11.5–15.5)
WBC: 5.1 10*3/uL (ref 4.0–10.5)
nRBC: 0 % (ref 0.0–0.2)

## 2021-05-20 LAB — BASIC METABOLIC PANEL
Anion gap: 6 (ref 5–15)
BUN: 18 mg/dL (ref 8–23)
CO2: 28 mmol/L (ref 22–32)
Calcium: 9.6 mg/dL (ref 8.9–10.3)
Chloride: 104 mmol/L (ref 98–111)
Creatinine, Ser: 1.05 mg/dL (ref 0.61–1.24)
GFR, Estimated: >60 mL/min (ref >60)
Glucose, Bld: 92 mg/dL (ref 70–99)
Potassium: 4.1 mmol/L (ref 3.5–5.1)
Sodium: 138 mmol/L (ref 135–145)

## 2021-05-20 LAB — SURGICAL PCR SCREEN
MRSA, PCR: NEGATIVE
Staphylococcus aureus: NEGATIVE

## 2021-05-20 LAB — SARS CORONAVIRUS 2 (TAT 6-24 HRS): SARS Coronavirus 2: NEGATIVE

## 2021-05-20 NOTE — Progress Notes (Addendum)
PCP - Dr. Tally Joe Cardiologist - Did see cardiologist for high B/P now managed by PCP per patient. Cannot recall cardiologist name who has since retired.   Chest x-ray - 05/20/21 EKG - 05/20/21 Stress Test - Had one with his cardiologist before 2018 ECHO - Denies Cardiac Cath - Denies  Sleep Study - Denies  DM - Denies  Aspirin Instructions: Stopped 2 weeks ago  ERAS Protcol - Yes  PRE-SURGERY Ensure given   COVID TEST- 05/20/21   Anesthesia review: no   Patient denies shortness of breath, fever, cough and chest pain at PAT appointment   All instructions explained to the patient, with a verbal understanding of the material. Patient agrees to go over the instructions while at home for a better understanding. Patient also instructed to wear a mask in public after being tested for COVID-19. The opportunity to ask questions was provided.

## 2021-05-23 ENCOUNTER — Ambulatory Visit (HOSPITAL_COMMUNITY): Payer: Medicare Other

## 2021-05-23 ENCOUNTER — Encounter (HOSPITAL_COMMUNITY): Payer: Self-pay | Admitting: Specialist

## 2021-05-23 ENCOUNTER — Ambulatory Visit (HOSPITAL_COMMUNITY): Payer: Medicare Other | Admitting: Certified Registered Nurse Anesthetist

## 2021-05-23 ENCOUNTER — Encounter (HOSPITAL_COMMUNITY)
Admission: RE | Disposition: A | Discharge status: Home / Self Care | Payer: Self-pay | Source: Home / Self Care | Attending: Specialist

## 2021-05-23 ENCOUNTER — Ambulatory Visit (HOSPITAL_COMMUNITY)
Admission: RE | Admit: 2021-05-23 | Discharge: 2021-05-24 | Disposition: A | Discharge status: Home / Self Care | Payer: Medicare Other | Attending: Specialist | Admitting: Specialist

## 2021-05-23 ENCOUNTER — Other Ambulatory Visit: Payer: Self-pay

## 2021-05-23 DIAGNOSIS — Z7952 Long term (current) use of systemic steroids: Secondary | ICD-10-CM | POA: Diagnosis not present

## 2021-05-23 DIAGNOSIS — M7138 Other bursal cyst, other site: Secondary | ICD-10-CM | POA: Diagnosis not present

## 2021-05-23 DIAGNOSIS — M5417 Radiculopathy, lumbosacral region: Secondary | ICD-10-CM | POA: Insufficient documentation

## 2021-05-23 DIAGNOSIS — Z791 Long term (current) use of non-steroidal anti-inflammatories (NSAID): Secondary | ICD-10-CM | POA: Insufficient documentation

## 2021-05-23 DIAGNOSIS — Z419 Encounter for procedure for purposes other than remedying health state, unspecified: Secondary | ICD-10-CM

## 2021-05-23 DIAGNOSIS — M48061 Spinal stenosis, lumbar region without neurogenic claudication: Secondary | ICD-10-CM | POA: Diagnosis present

## 2021-05-23 DIAGNOSIS — Z87891 Personal history of nicotine dependence: Secondary | ICD-10-CM | POA: Insufficient documentation

## 2021-05-23 HISTORY — PX: LUMBAR LAMINECTOMY/DECOMPRESSION MICRODISCECTOMY: SHX5026

## 2021-05-23 SURGERY — LUMBAR LAMINECTOMY/DECOMPRESSION MICRODISCECTOMY 1 LEVEL
Anesthesia: General | Laterality: Right

## 2021-05-23 MED ORDER — OXYCODONE HCL 5 MG/5ML PO SOLN: 5.0000 mg | Freq: Once | ORAL | Status: DC | PRN

## 2021-05-23 MED ORDER — PROPOFOL 10 MG/ML IV BOLUS
INTRAVENOUS | Status: AC
  Filled 2021-05-23: qty 40

## 2021-05-23 MED ORDER — OXYCODONE HCL 5 MG PO TABS
10.0000 mg | ORAL_TABLET | ORAL | Status: DC | PRN
  Administered 2021-05-23: 10 mg via ORAL
  Filled 2021-05-23: qty 2

## 2021-05-23 MED ORDER — CANDESARTAN CILEXETIL-HCTZ 16-12.5 MG PO TABS: 1.0000 | ORAL_TABLET | Freq: Every day | ORAL | Status: DC

## 2021-05-23 MED ORDER — LACTATED RINGERS IV SOLN: INTRAVENOUS | Status: DC

## 2021-05-23 MED ORDER — ACETAMINOPHEN 160 MG/5ML PO SOLN: 325.0000 mg | ORAL | Status: DC | PRN

## 2021-05-23 MED ORDER — ROCURONIUM BROMIDE 10 MG/ML (PF) SYRINGE
PREFILLED_SYRINGE | INTRAVENOUS | Status: DC | PRN
  Administered 2021-05-23: 60 mg via INTRAVENOUS

## 2021-05-23 MED ORDER — FENTANYL CITRATE (PF) 100 MCG/2ML IJ SOLN
25.0000 ug | INTRAMUSCULAR | Status: DC | PRN
  Administered 2021-05-23: 50 ug via INTRAVENOUS

## 2021-05-23 MED ORDER — OXYCODONE HCL 5 MG PO TABS: 5.0000 mg | ORAL_TABLET | ORAL | 0 refills | Status: DC | PRN

## 2021-05-23 MED ORDER — PHENOL 1.4 % MT LIQD: 1.0000 | OROMUCOSAL | Status: DC | PRN

## 2021-05-23 MED ORDER — LIDOCAINE 2% (20 MG/ML) 5 ML SYRINGE
INTRAMUSCULAR | Status: DC | PRN
  Administered 2021-05-23: 40 mg via INTRAVENOUS
  Administered 2021-05-23: 60 mg via INTRAVENOUS

## 2021-05-23 MED ORDER — POLYETHYLENE GLYCOL 3350 17 G PO PACK: 17.0000 g | PACK | Freq: Every day | ORAL | 0 refills | Status: DC

## 2021-05-23 MED ORDER — ACETAMINOPHEN 10 MG/ML IV SOLN: 1000.0000 mg | Freq: Once | INTRAVENOUS | Status: DC | PRN

## 2021-05-23 MED ORDER — FENTANYL CITRATE (PF) 250 MCG/5ML IJ SOLN
INTRAMUSCULAR | Status: AC
  Filled 2021-05-23: qty 5

## 2021-05-23 MED ORDER — AMISULPRIDE (ANTIEMETIC) 5 MG/2ML IV SOLN: 10.0000 mg | Freq: Once | INTRAVENOUS | Status: DC | PRN

## 2021-05-23 MED ORDER — CEFAZOLIN SODIUM-DEXTROSE 2-4 GM/100ML-% IV SOLN
2.0000 g | INTRAVENOUS | Status: AC
  Administered 2021-05-23: 2 g via INTRAVENOUS
  Filled 2021-05-23: qty 100

## 2021-05-23 MED ORDER — SUGAMMADEX SODIUM 200 MG/2ML IV SOLN
INTRAVENOUS | Status: DC | PRN
  Administered 2021-05-23: 200 mg via INTRAVENOUS

## 2021-05-23 MED ORDER — CHLORHEXIDINE GLUCONATE 0.12 % MT SOLN
15.0000 mL | Freq: Once | OROMUCOSAL | Status: AC
  Administered 2021-05-23: 15 mL via OROMUCOSAL

## 2021-05-23 MED ORDER — FENTANYL CITRATE (PF) 250 MCG/5ML IJ SOLN
INTRAMUSCULAR | Status: DC | PRN
  Administered 2021-05-23 (×2): 50 ug via INTRAVENOUS

## 2021-05-23 MED ORDER — PROPOFOL 10 MG/ML IV BOLUS
INTRAVENOUS | Status: DC | PRN
  Administered 2021-05-23: 150 mg via INTRAVENOUS

## 2021-05-23 MED ORDER — THROMBIN 20000 UNITS EX SOLR
CUTANEOUS | Status: DC | PRN
  Administered 2021-05-23: 20 mL via TOPICAL

## 2021-05-23 MED ORDER — ORAL CARE MOUTH RINSE: 15.0000 mL | Freq: Once | OROMUCOSAL | Status: AC

## 2021-05-23 MED ORDER — MELATONIN 5 MG PO TABS: 5.0000 mg | ORAL_TABLET | Freq: Every evening | ORAL | Status: DC | PRN

## 2021-05-23 MED ORDER — POLYETHYLENE GLYCOL 3350 17 G PO PACK
17.0000 g | PACK | Freq: Every day | ORAL | Status: DC | PRN
  Administered 2021-05-23: 17 g via ORAL
  Filled 2021-05-23: qty 1

## 2021-05-23 MED ORDER — KCL IN DEXTROSE-NACL 20-5-0.45 MEQ/L-%-% IV SOLN: INTRAVENOUS | Status: AC

## 2021-05-23 MED ORDER — ALUM & MAG HYDROXIDE-SIMETH 200-200-20 MG/5ML PO SUSP: 30.0000 mL | Freq: Four times a day (QID) | ORAL | Status: DC | PRN

## 2021-05-23 MED ORDER — CEFAZOLIN SODIUM-DEXTROSE 2-4 GM/100ML-% IV SOLN
2.0000 g | Freq: Three times a day (TID) | INTRAVENOUS | Status: AC
  Administered 2021-05-23 (×2): 2 g via INTRAVENOUS
  Filled 2021-05-23 (×2): qty 100

## 2021-05-23 MED ORDER — FENTANYL CITRATE (PF) 100 MCG/2ML IJ SOLN
INTRAMUSCULAR | Status: AC
  Filled 2021-05-23: qty 2

## 2021-05-23 MED ORDER — ONDANSETRON HCL 4 MG PO TABS: 4.0000 mg | ORAL_TABLET | Freq: Four times a day (QID) | ORAL | Status: DC | PRN

## 2021-05-23 MED ORDER — THROMBIN 20000 UNITS EX SOLR
CUTANEOUS | Status: AC
  Filled 2021-05-23: qty 20000

## 2021-05-23 MED ORDER — EPHEDRINE 5 MG/ML INJ
INTRAVENOUS | Status: AC
  Filled 2021-05-23: qty 5

## 2021-05-23 MED ORDER — MIDAZOLAM HCL 2 MG/2ML IJ SOLN
INTRAMUSCULAR | Status: DC | PRN
  Administered 2021-05-23: 2 mg via INTRAVENOUS

## 2021-05-23 MED ORDER — OXYCODONE HCL 5 MG PO TABS
5.0000 mg | ORAL_TABLET | ORAL | Status: DC | PRN
  Administered 2021-05-23 – 2021-05-24 (×3): 5 mg via ORAL
  Filled 2021-05-23 (×3): qty 1

## 2021-05-23 MED ORDER — ONDANSETRON HCL 4 MG/2ML IJ SOLN: 4.0000 mg | Freq: Four times a day (QID) | INTRAMUSCULAR | Status: DC | PRN

## 2021-05-23 MED ORDER — METHOCARBAMOL 1000 MG/10ML IJ SOLN
500.0000 mg | Freq: Four times a day (QID) | INTRAVENOUS | Status: DC | PRN
  Filled 2021-05-23: qty 5

## 2021-05-23 MED ORDER — MENTHOL 3 MG MT LOZG: 1.0000 | LOZENGE | OROMUCOSAL | Status: DC | PRN

## 2021-05-23 MED ORDER — DOCUSATE SODIUM 100 MG PO CAPS: 100.0000 mg | ORAL_CAPSULE | Freq: Two times a day (BID) | ORAL | 1 refills | Status: DC | PRN

## 2021-05-23 MED ORDER — TRANEXAMIC ACID-NACL 1000-0.7 MG/100ML-% IV SOLN
1000.0000 mg | INTRAVENOUS | Status: AC
  Administered 2021-05-23: 1000 mg via INTRAVENOUS
  Filled 2021-05-23: qty 100

## 2021-05-23 MED ORDER — LACTATED RINGERS IV SOLN: INTRAVENOUS | Status: DC | PRN

## 2021-05-23 MED ORDER — HYDROCHLOROTHIAZIDE 12.5 MG PO CAPS
12.5000 mg | ORAL_CAPSULE | Freq: Every day | ORAL | Status: DC
  Administered 2021-05-23: 12.5 mg via ORAL
  Filled 2021-05-23: qty 1

## 2021-05-23 MED ORDER — ONDANSETRON HCL 4 MG/2ML IJ SOLN
INTRAMUSCULAR | Status: DC | PRN
Start: 2021-05-23 — End: 2021-05-23
  Administered 2021-05-23: 4 mg via INTRAVENOUS

## 2021-05-23 MED ORDER — PROMETHAZINE HCL 25 MG/ML IJ SOLN: 6.2500 mg | INTRAMUSCULAR | Status: DC | PRN

## 2021-05-23 MED ORDER — ACETAMINOPHEN 500 MG PO TABS
1000.0000 mg | ORAL_TABLET | Freq: Four times a day (QID) | ORAL | Status: AC
Start: 2021-05-23 — End: 2021-05-24
  Administered 2021-05-23 – 2021-05-24 (×4): 1000 mg via ORAL
  Filled 2021-05-23 (×4): qty 2

## 2021-05-23 MED ORDER — MIDAZOLAM HCL 2 MG/2ML IJ SOLN
INTRAMUSCULAR | Status: AC
  Filled 2021-05-23: qty 2

## 2021-05-23 MED ORDER — BUPIVACAINE-EPINEPHRINE 0.5% -1:200000 IJ SOLN
INTRAMUSCULAR | Status: AC
  Filled 2021-05-23: qty 1

## 2021-05-23 MED ORDER — ACETAMINOPHEN 325 MG PO TABS: 325.0000 mg | ORAL_TABLET | ORAL | Status: DC | PRN

## 2021-05-23 MED ORDER — 0.9 % SODIUM CHLORIDE (POUR BTL) OPTIME
TOPICAL | Status: DC | PRN
  Administered 2021-05-23: 1000 mL

## 2021-05-23 MED ORDER — IRBESARTAN 150 MG PO TABS
150.0000 mg | ORAL_TABLET | Freq: Every day | ORAL | Status: DC
  Administered 2021-05-23: 150 mg via ORAL
  Filled 2021-05-23: qty 1

## 2021-05-23 MED ORDER — METHOCARBAMOL 500 MG PO TABS
ORAL_TABLET | ORAL | Status: AC
  Filled 2021-05-23: qty 1

## 2021-05-23 MED ORDER — METHOCARBAMOL 500 MG PO TABS
500.0000 mg | ORAL_TABLET | Freq: Four times a day (QID) | ORAL | Status: DC | PRN
  Administered 2021-05-23 (×2): 500 mg via ORAL
  Filled 2021-05-23: qty 1

## 2021-05-23 MED ORDER — BISACODYL 5 MG PO TBEC: 5.0000 mg | DELAYED_RELEASE_TABLET | Freq: Every day | ORAL | Status: DC | PRN

## 2021-05-23 MED ORDER — DEXAMETHASONE SODIUM PHOSPHATE 10 MG/ML IJ SOLN
INTRAMUSCULAR | Status: DC | PRN
  Administered 2021-05-23: 10 mg via INTRAVENOUS

## 2021-05-23 MED ORDER — HYDROMORPHONE HCL 1 MG/ML IJ SOLN: 0.5000 mg | INTRAMUSCULAR | Status: DC | PRN

## 2021-05-23 MED ORDER — ACETAMINOPHEN 10 MG/ML IV SOLN
1000.0000 mg | INTRAVENOUS | Status: AC
  Administered 2021-05-23: 1000 mg via INTRAVENOUS
  Filled 2021-05-23: qty 100

## 2021-05-23 MED ORDER — DOCUSATE SODIUM 100 MG PO CAPS
100.0000 mg | ORAL_CAPSULE | Freq: Two times a day (BID) | ORAL | Status: DC
  Administered 2021-05-23 (×2): 100 mg via ORAL
  Filled 2021-05-23 (×2): qty 1

## 2021-05-23 MED ORDER — OXYCODONE HCL 5 MG PO TABS: 5.0000 mg | ORAL_TABLET | Freq: Once | ORAL | Status: DC | PRN

## 2021-05-23 MED ORDER — RISAQUAD PO CAPS
1.0000 | ORAL_CAPSULE | Freq: Every day | ORAL | Status: DC
  Administered 2021-05-23 – 2021-05-24 (×2): 1 via ORAL
  Filled 2021-05-23 (×2): qty 1

## 2021-05-23 MED ORDER — MAGNESIUM CITRATE PO SOLN
1.0000 | Freq: Once | ORAL | Status: DC | PRN
  Filled 2021-05-23: qty 296

## 2021-05-23 MED ORDER — ADULT MULTIVITAMIN W/MINERALS CH
1.0000 | ORAL_TABLET | Freq: Every day | ORAL | Status: DC
  Administered 2021-05-23: 1 via ORAL
  Filled 2021-05-23: qty 1

## 2021-05-23 MED ORDER — EPHEDRINE SULFATE-NACL 50-0.9 MG/10ML-% IV SOSY
PREFILLED_SYRINGE | INTRAVENOUS | Status: DC | PRN
  Administered 2021-05-23: 5 mg via INTRAVENOUS

## 2021-05-23 MED ORDER — BUPIVACAINE-EPINEPHRINE 0.5% -1:200000 IJ SOLN
INTRAMUSCULAR | Status: DC | PRN
  Administered 2021-05-23: 3 mL

## 2021-05-23 SURGICAL SUPPLY — 54 items
BAG COUNTER SPONGE SURGICOUNT (BAG) ×2 IMPLANT
BAG DECANTER FOR FLEXI CONT (MISCELLANEOUS) ×2 IMPLANT
BAND RUBBER #18 3X1/16 STRL (MISCELLANEOUS) ×4 IMPLANT
BUR EGG ELITE 5.0 (BURR) IMPLANT
BUR RND DIAMOND ELITE 4.0 (BURR) ×2 IMPLANT
BUR STRYKR EGG 5.0 (BURR) IMPLANT
CLEANER TIP ELECTROSURG 2X2 (MISCELLANEOUS) ×2 IMPLANT
CNTNR URN SCR LID CUP LEK RST (MISCELLANEOUS) ×1 IMPLANT
CONT SPEC 4OZ STRL OR WHT (MISCELLANEOUS) ×2
DRAPE LAPAROTOMY 100X72X124 (DRAPES) ×2 IMPLANT
DRAPE MICROSCOPE LEICA (MISCELLANEOUS) ×2 IMPLANT
DRAPE SHEET LG 3/4 BI-LAMINATE (DRAPES) ×2 IMPLANT
DRAPE SURG 17X11 SM STRL (DRAPES) ×2 IMPLANT
DRAPE UTILITY XL STRL (DRAPES) ×2 IMPLANT
DRSG AQUACEL AG ADV 3.5X 4 (GAUZE/BANDAGES/DRESSINGS) ×2 IMPLANT
DRSG AQUACEL AG ADV 3.5X 6 (GAUZE/BANDAGES/DRESSINGS) IMPLANT
DRSG TELFA 3X8 NADH (GAUZE/BANDAGES/DRESSINGS) IMPLANT
DURAPREP 26ML APPLICATOR (WOUND CARE) ×2 IMPLANT
DURASEAL SPINE SEALANT 3ML (MISCELLANEOUS) IMPLANT
ELECT BLADE 4.0 EZ CLEAN MEGAD (MISCELLANEOUS) ×2
ELECT REM PT RETURN 9FT ADLT (ELECTROSURGICAL) ×2
ELECTRODE BLDE 4.0 EZ CLN MEGD (MISCELLANEOUS) ×1 IMPLANT
ELECTRODE REM PT RTRN 9FT ADLT (ELECTROSURGICAL) ×1 IMPLANT
GLOVE SURG POLYISO LF SZ7.5 (GLOVE) ×4 IMPLANT
GLOVE SURG POLYISO LF SZ8 (GLOVE) ×4 IMPLANT
GLOVE SURG UNDER POLY LF SZ7 (GLOVE) ×2 IMPLANT
GOWN STRL REUS W/ TWL LRG LVL3 (GOWN DISPOSABLE) ×2 IMPLANT
GOWN STRL REUS W/ TWL XL LVL3 (GOWN DISPOSABLE) ×1 IMPLANT
GOWN STRL REUS W/TWL LRG LVL3 (GOWN DISPOSABLE) ×4
GOWN STRL REUS W/TWL XL LVL3 (GOWN DISPOSABLE) ×2
IV CATH 14GX2 1/4 (CATHETERS) ×2 IMPLANT
KIT BASIN OR (CUSTOM PROCEDURE TRAY) ×2 IMPLANT
NEEDLE 22X1 1/2 (OR ONLY) (NEEDLE) ×2 IMPLANT
NEEDLE SPNL 18GX3.5 QUINCKE PK (NEEDLE) ×4 IMPLANT
PACK LAMINECTOMY NEURO (CUSTOM PROCEDURE TRAY) ×2 IMPLANT
PATTIES SURGICAL .25X.25 (GAUZE/BANDAGES/DRESSINGS) ×2 IMPLANT
PATTIES SURGICAL .75X.75 (GAUZE/BANDAGES/DRESSINGS) ×2 IMPLANT
SPONGE SURGIFOAM ABS GEL 100 (HEMOSTASIS) ×2 IMPLANT
SPONGE T-LAP 4X18 ~~LOC~~+RFID (SPONGE) ×4 IMPLANT
STAPLER VISISTAT (STAPLE) IMPLANT
STRIP CLOSURE SKIN 1/2X4 (GAUZE/BANDAGES/DRESSINGS) ×2 IMPLANT
SUT NURALON 4 0 TR CR/8 (SUTURE) IMPLANT
SUT PROLENE 3 0 PS 2 (SUTURE) ×2 IMPLANT
SUT VIC AB 1 CT1 27 (SUTURE)
SUT VIC AB 1 CT1 27XBRD ANTBC (SUTURE) IMPLANT
SUT VIC AB 1-0 CT2 27 (SUTURE) ×4 IMPLANT
SUT VIC AB 2-0 CT1 27 (SUTURE) ×2
SUT VIC AB 2-0 CT1 TAPERPNT 27 (SUTURE) ×1 IMPLANT
SUT VIC AB 2-0 CT2 27 (SUTURE) ×2 IMPLANT
SYR 3ML LL SCALE MARK (SYRINGE) ×2 IMPLANT
TOWEL GREEN STERILE (TOWEL DISPOSABLE) ×2 IMPLANT
TOWEL GREEN STERILE FF (TOWEL DISPOSABLE) ×2 IMPLANT
TRAY FOLEY MTR SLVR 16FR STAT (SET/KITS/TRAYS/PACK) ×2 IMPLANT
YANKAUER SUCT BULB TIP NO VENT (SUCTIONS) ×2 IMPLANT

## 2021-05-23 NOTE — Op Note (Signed)
Tony Dominguez, Dominguez MEDICAL RECORD NO: [id number] ACCOUNT NO: [id number] DATE OF BIRTH: 1956-09-28 FACILITY: [facility] LOCATION: [facility] PHYSICIAN: Javier Docker, MD  Operative Report   DATE OF PROCEDURE: 05/23/2021  PREOPERATIVE DIAGNOSIS:  Spinal stenosis with synovial cyst, L5-S1, right.  POSTOPERATIVE DIAGNOSIS:  Spinal stenosis with synovial cyst, L5-S1, right.  PROCEDURE PERFORMED:   1.  Microlumbar decompression, L5-S1, right. 2.  Foraminotomy, L5-S1, right. 3.  Excision of synovial cyst, L5-S1, right.  ANESTHESIA:  General.  ASSISTANT: Andrez Grime, PA.  HISTORY:  A 65 year old male with facet hypertrophy and a synovial cyst impinging upon his L5-S1 nerve roots.  He was indicated for decompression by a cyst removal and foraminotomies, decompress the nerve roots.  Risks and benefits discussed including  bleeding, infection, damage to neurovascular structures, no change in symptoms, worsening symptoms, DVT, PE, anesthetic complications, etc.  DESCRIPTION OF PROCEDURE:  With the patient in supine position, after induction of adequate general anesthesia, Foley to gravity he was placed prone on the Wilson frame.  All bony prominences were well padded.  Lumbar region was prepped and draped in the  usual sterile fashion.  Two 18-gauge spinal needle was utilized to localize the L5-S1 interspace, confirmed with x-ray.  Incision was made from the spinous process L5 to S1.  Subcutaneous tissue was dissected, electrocautery was utilized to achieve  hemostasis.  Dorsal lumbar fascia was divided in line of skin incision.  Paraspinous muscle elevated from the lamina of L5 and S1.  Operating microscope was draped and brought in the surgical field.  Confirmatory radiograph obtained.  A very small  interlaminar window was noted.  I skeletonized the interlaminar window with a curette.  I then used a high-speed bur to perform hemilaminotomy of the caudad edge of L4, preserving a wide portion of  the pars.  This is less than 50% of its width.  The cyst  was noted to be just below the disk space.  Following this, I identified and skeletonized the superior articulating process of S1.  I detached ligamentum flavum from the cephalad edge of S1.  I identified the caudad edge of the superior articulating  process.  There had been a small cyst there as well and that was excised.  Fluid was removed from the joint.  I then used a micro curette to begin removal of the medial portion of the superior articulating process.  This detached it from the ligamentum  flavum.  Ligamentum flavum removed carefully from the interspace after a neuro patty placed beneath it to protect the neural elements.  I used the E[place]brook 4 then to gain access to the canal.  I gently mobilized the S1 nerve root medially and protected  the nerve root.  I then decompressed the lateral recess, the medial border of the pedicle with 2 mm Kerrison extending cephalad, where the cyst was noted to be extending from the superior part of the joint into the canal.  This was separated from the  thecal sac.  There was extensive epidural venous plexus noted there as well consistent with compression.  I then used a straight microcurette and a 2 mm Kerrison to perform foraminotomy of L4, removing a portion of the cephalad aspect of the superior  articulating process.  Protecting the nerve root at all times.  This was excised with a synovial lining.  Next, I examined the disk.  There was no evidence of disk herniation.  I performed a foraminotomy of S1 and a Woodson retractor passed freely out  the foramen of L5 and S1, 1 cm of excursion of the S1 nerve root medial pedicle.  There was no compression upon that.  Copiously irrigated the wound.  Confirmatory radiograph obtained with instruments in the foramen of S1 and L5.  Bone wax was placed on  the cancellous surfaces.  Bipolar cautery was utilized to achieve any mild bleeding.  Valsalva 40 was performed.   There was no evidence of CSF leakage.  I felt the nerve roots were well decompressed.  I then placed a small patty of Gelfoam in the  inferior aspect of the facet.  We removed the [facility] retractor, paraspinous muscles cauterized, very mild bleeding.  It was copiously irrigated.  Inspection revealed no evidence of CSF leakage or active bleeding.  I then repaired the dorsal lumbar  fascia with 1 Vicryl interrupted figure-of-eight suture, subcutaneous with 2-0 and skin with Prolene.  Sterile dressing applied.  She was placed supine on the hospital bed, extubated without difficulty and transported to the recovery room in satisfactory  condition.  The patient tolerated the procedure well.  No complications.    ASSISTANT:  Andrez Grime, PA.  Minimal blood loss.    No specimen.    [email or web address] D: 05/23/2021 9:24:16 am T: 05/23/2021 10:11:00 am  JOB: [id number]/ [id number]

## 2021-05-23 NOTE — Transfer of Care (Signed)
Immediate Anesthesia Transfer of Care Note  Patient: Tony Dominguez  Procedure(s) Performed: Microlumbar decompression Lumbar five-Sacral one right with excision of synovial cyst (Right)  Patient Location: PACU  Anesthesia Type:General  Level of Consciousness: drowsy  Airway & Oxygen Therapy: Patient Spontanous Breathing and Patient connected to nasal cannula oxygen  Post-op Assessment: Report given to RN and Post -op Vital signs reviewed and stable  Post vital signs: Reviewed and stable  Last Vitals:  Vitals Value Taken Time  BP 101/47 05/23/21 0942  Temp    Pulse 63 05/23/21 0945  Resp 9 05/23/21 0945  SpO2 97 % 05/23/21 0945  Vitals shown include unvalidated device data.  Last Pain:  Vitals:   05/23/21 0651  TempSrc:   PainSc: 0-No pain         Complications: No notable events documented.

## 2021-05-23 NOTE — Brief Op Note (Signed)
05/23/2021  9:17 AM  PATIENT:  Tony Dominguez  65 y.o. male  PRE-OPERATIVE DIAGNOSIS:  Stenosis synovial cyst Lumbar five sacral one right  POST-OPERATIVE DIAGNOSIS:  Stenosis synovial cyst Lumbar five sacral one right  PROCEDURE:  Procedure(s): Microlumbar decompression Lumbar five-Sacral one right with excision of synovial cyst (Right)  SURGEON:  Surgeon(s) and Role:    Jene Every, MD - Primary  PHYSICIAN ASSISTANT:   ASSISTANTS: Bissell   ANESTHESIA:   general  EBL:  25 mL   BLOOD ADMINISTERED:none  DRAINS: none   LOCAL MEDICATIONS USED:  MARCAINE     SPECIMEN:  No Specimen  DISPOSITION OF SPECIMEN:  PATHOLOGY  COUNTS:  YES  TOURNIQUET:  * No tourniquets in log *  DICTATION: .Other Dictation: Dictation Number [id number]  PLAN OF CARE: Admit for overnight observation  PATIENT DISPOSITION:  PACU - hemodynamically stable.   Delay start of Pharmacological VTE agent (>24hrs) due to surgical blood loss or risk of bleeding: yes

## 2021-05-23 NOTE — Anesthesia Procedure Notes (Signed)
Procedure Name: Intubation Date/Time: 05/23/2021 7:39 AM Performed by: Valda Favia, CRNA Pre-anesthesia Checklist: Patient identified, Emergency Drugs available, Suction available, Patient being monitored and Timeout performed Patient Re-evaluated:Patient Re-evaluated prior to induction Oxygen Delivery Method: Circle system utilized Preoxygenation: Pre-oxygenation with 100% oxygen Induction Type: IV induction Ventilation: Mask ventilation without difficulty Laryngoscope Size: Mac and 4 Grade View: Grade I Tube type: Oral Tube size: 7.5 mm Number of attempts: 1 Airway Equipment and Method: Stylet Placement Confirmation: ETT inserted through vocal cords under direct vision, positive ETCO2 and breath sounds checked- equal and bilateral Secured at: 23 cm Tube secured with: Tape Dental Injury: Teeth and Oropharynx as per pre-operative assessment

## 2021-05-23 NOTE — Anesthesia Postprocedure Evaluation (Signed)
Anesthesia Post Note  Patient: Tony Dominguez  Procedure(s) Performed: Microlumbar decompression Lumbar five-Sacral one right with excision of synovial cyst (Right)     Patient location during evaluation: PACU Anesthesia Type: General Level of consciousness: awake and alert Pain management: pain level controlled Vital Signs Assessment: post-procedure vital signs reviewed and stable Respiratory status: spontaneous breathing, nonlabored ventilation, respiratory function stable and patient connected to nasal cannula oxygen Cardiovascular status: blood pressure returned to baseline and stable Postop Assessment: no apparent nausea or vomiting Anesthetic complications: no   No notable events documented.  Last Vitals:  Vitals:   05/23/21 1037 05/23/21 1100  BP: 118/69 (!) 106/56  Pulse: 62 65  Resp: (!) 21 20  Temp: (!) 36.4 C   SpO2: 93% 100%    Last Pain:  Vitals:   05/23/21 1037  TempSrc:   PainSc: Asleep                 Shelton Silvas

## 2021-05-23 NOTE — Interval H&P Note (Signed)
History and Physical Interval Note:  05/23/2021 7:14 AM  Tony Dominguez  has presented today for surgery, with the diagnosis of Stenosis synovial cyst L5-S1 right.  The various methods of treatment have been discussed with the patient and family. After consideration of risks, benefits and other options for treatment, the patient has consented to  Procedure(s) with comments: Microlumbar decompression L5-S1 right with excision of synovial cyst (Right) - 2 hrs as a surgical intervention.  The patient's history has been reviewed, patient examined, no change in status, stable for surgery.  I have reviewed the patient's chart and labs.  Questions were answered to the patient's satisfaction.     Javier Docker

## 2021-05-23 NOTE — Discharge Instructions (Addendum)

## 2021-05-23 NOTE — Anesthesia Preprocedure Evaluation (Addendum)
Anesthesia Evaluation  Patient identified by MRN, date of birth, ID band Patient awake    Reviewed: Allergy & Precautions, NPO status , Patient's Chart, lab work & pertinent test results  Airway Mallampati: II  TM Distance: >3 FB Neck ROM: Full    Dental  (+) Teeth Intact, Dental Advisory Given   Pulmonary former smoker,    breath sounds clear to auscultation       Cardiovascular hypertension, Pt. on medications  Rhythm:Regular Rate:Normal     Neuro/Psych negative neurological ROS  negative psych ROS   GI/Hepatic negative GI ROS, Neg liver ROS,   Endo/Other  negative endocrine ROS  Renal/GU negative Renal ROS     Musculoskeletal   Abdominal Normal abdominal exam  (+)   Peds  Hematology negative hematology ROS (+)   Anesthesia Other Findings   Reproductive/Obstetrics                            Anesthesia Physical Anesthesia Plan  ASA: 2  Anesthesia Plan: General   Post-op Pain Management:    Induction: Intravenous  PONV Risk Score and Plan: 3 and Ondansetron, Dexamethasone and Midazolam  Airway Management Planned: Oral ETT  Additional Equipment: None  Intra-op Plan:   Post-operative Plan: Extubation in OR  Informed Consent: I have reviewed the patients History and Physical, chart, labs and discussed the procedure including the risks, benefits and alternatives for the proposed anesthesia with the patient or authorized representative who has indicated his/her understanding and acceptance.     Dental advisory given  Plan Discussed with: CRNA  Anesthesia Plan Comments: (Lab Results      Component                Value               Date                      WBC                      5.1                 05/20/2021                HGB                      14.6                05/20/2021                HCT                      43.1                05/20/2021                MCV                       93.3                05/20/2021                PLT                      176                 05/20/2021           )  Anesthesia Quick Evaluation  

## 2021-05-24 ENCOUNTER — Encounter (HOSPITAL_COMMUNITY): Payer: Self-pay | Admitting: Specialist

## 2021-05-24 DIAGNOSIS — M48061 Spinal stenosis, lumbar region without neurogenic claudication: Secondary | ICD-10-CM | POA: Diagnosis not present

## 2021-05-24 LAB — BASIC METABOLIC PANEL
Anion gap: 9 (ref 5–15)
BUN: 13 mg/dL (ref 8–23)
CO2: 27 mmol/L (ref 22–32)
Calcium: 10 mg/dL (ref 8.9–10.3)
Chloride: 103 mmol/L (ref 98–111)
Creatinine, Ser: 0.9 mg/dL (ref 0.61–1.24)
GFR, Estimated: >60 mL/min (ref >60)
Glucose, Bld: 144 mg/dL — ABNORMAL HIGH (ref 70–99)
Potassium: 4.5 mmol/L (ref 3.5–5.1)
Sodium: 139 mmol/L (ref 135–145)

## 2021-05-24 NOTE — Progress Notes (Signed)
Subjective: 1 Day Post-Op Procedure(s) (LRB): Microlumbar decompression Lumbar five-Sacral one right with excision of synovial cyst (Right) Patient reports pain as mild.  Reports incisional pain. No BM yet, voiding without difficulty. Feels ready to go home.   Objective: Vital signs in last 24 hours: Temp:  [97.5 F (36.4 C)-98 F (36.7 C)] 98 F (36.7 C) (07/22 0736) Pulse Rate:  [54-91] 66 (07/22 0736) Resp:  [12-21] 18 (07/22 0736) BP: (101-126)/(47-84) 110/74 (07/22 0736) SpO2:  [93 %-100 %] 96 % (07/22 0736)  Intake/Output from previous day: 07/21 0701 - 07/22 0700 In: 800 [I.V.:600; IV Piggyback:200] Out: 135 [Urine:110; Blood:25] Intake/Output this shift: No intake/output data recorded.  No results for input(s): HGB in the last 72 hours. No results for input(s): WBC, RBC, HCT, PLT in the last 72 hours. Recent Labs    05/24/21 0237  NA 139  K 4.5  CL 103  CO2 27  BUN 13  CREATININE 0.90  GLUCOSE 144*  CALCIUM 10.0   No results for input(s): LABPT, INR in the last 72 hours.  Neurologically intact ABD soft Neurovascular intact Sensation intact distally Intact pulses distally Dorsiflexion/Plantar flexion intact Incision: dressing C/D/I and no drainage No cellulitis present Compartment soft No calf pain or sign of DVT   Assessment/Plan: 1 Day Post-Op Procedure(s) (LRB): Microlumbar decompression Lumbar five-Sacral one right with excision of synovial cyst (Right) Advance diet Up with therapy D/C IV fluids Discussed D/C instructions, Lspine precautions, dressing instructions D/C home today Pt has magnesium citrate to use at home if needed   Tony Dominguez 05/24/2021, 8:17 AM

## 2021-05-24 NOTE — Discharge Summary (Signed)
Physician Discharge Summary   Patient ID: Tony Dominguez MRN: [id number] DOB/AGE: 67/03/1956 67 y.o.  Admit date: 05/23/2021 Discharge date: 05/24/2021  Primary Diagnosis:   Stenosis synovial cyst Lumbar five sacral one right  Admission Diagnoses:  Past Medical History:  Diagnosis Date   Hypercholesteremia    Discharge Diagnoses:   Active Problems:   Spinal stenosis, lumbar  Procedure:  Procedure(s) (LRB): Microlumbar decompression Lumbar five-Sacral one right with excision of synovial cyst (Right)   Consults: None  HPI:  See H&P    Laboratory Data: Hospital Outpatient Visit on 05/20/2021  Component Date Value Ref Range Status   WBC 05/20/2021 5.1  4.0 - 10.5 K/uL Final   RBC 05/20/2021 4.62  4.22 - 5.81 MIL/uL Final   Hemoglobin 05/20/2021 14.6  13.0 - 17.0 g/dL Final   HCT 40/98/119107/18/2022 43.1  39.0 - 52.0 % Final   MCV 05/20/2021 93.3  80.0 - 100.0 fL Final   MCH 05/20/2021 31.6  26.0 - 34.0 pg Final   MCHC 05/20/2021 33.9  30.0 - 36.0 g/dL Final   RDW 47/82/956207/18/2022 13.0  11.5 - 15.5 % Final   Platelets 05/20/2021 176  150 - 400 K/uL Final   nRBC 05/20/2021 0.0  0.0 - 0.2 % Final   Performed at [facility], [address] [place], [place] [address]   Sodium 05/20/2021 138  135 - 145 mmol/L Final   Potassium 05/20/2021 4.1  3.5 - 5.1 mmol/L Final   Chloride 05/20/2021 104  98 - 111 mmol/L Final   CO2 05/20/2021 28  22 - 32 mmol/L Final   Glucose, Bld 05/20/2021 92  70 - 99 mg/dL Final   Glucose reference range applies only to samples taken after fasting for at least 8 hours.   BUN 05/20/2021 18  8 - 23 mg/dL Final   Creatinine, Ser 05/20/2021 1.05  0.61 - 1.24 mg/dL Final   Calcium 57/84/696207/18/2022 9.6  8.9 - 10.3 mg/dL Final   GFR, Estimated 05/20/2021 >60  >60 mL/min Final   Comment: (NOTE) Calculated using the CKD-EPI Creatinine Equation (2021)    Anion gap 05/20/2021 6  5 - 15 Final   Performed at [facility], [address] [place], [place] [address]    MRSA, PCR 05/20/2021 NEGATIVE  NEGATIVE Final   Staphylococcus aureus 05/20/2021 NEGATIVE  NEGATIVE Final   Comment: (NOTE) The Xpert SA Assay (FDA approved for NASAL specimens in patients 67 years of age and older), is one component of a comprehensive surveillance program. It is not intended to diagnose infection nor to guide or monitor treatment. Performed at [facility], [address] [place], [place] [address]    SARS Coronavirus 2 05/20/2021 NEGATIVE  NEGATIVE Final   Comment: (NOTE) SARS-CoV-2 target nucleic acids are NOT DETECTED.  The SARS-CoV-2 RNA is generally detectable in upper and lower respiratory specimens during the acute phase of infection. Negative results do not preclude SARS-CoV-2 infection, do not rule out co-infections with other pathogens, and should not be used as the sole basis for treatment or other patient management decisions. Negative results must be combined with clinical observations, patient history, and epidemiological information. The expected result is Negative.  Fact Sheet for Patients: HairSlick.nohttps://www.fda.gov/media/138098/download  Fact Sheet for Healthcare Providers: quierodirigir.comhttps://www.fda.gov/media/138095/download  This test is not yet approved or cleared by the [place] FDA and  has been authorized for detection and/or diagnosis of SARS-CoV-2 by FDA under an Emergency Use Authorization (EUA). This EUA will remain  in effect (meaning this test can be used) for the duration of the COVID-19 declaration under Se                          ction 564(b)(1) of the Act, 21 U.S.C. section 360bbb-3(b)(1), unless the authorization is terminated or revoked sooner.  Performed at [facility], [address] [place], [place] [address]    No results for input(s): HGB in the last 72 hours. No results for input(s): WBC, RBC, HCT, PLT in the last 72 hours. Recent Labs    05/24/21 0237  NA 139  K 4.5  CL 103  CO2 27  BUN 13  CREATININE  0.90  GLUCOSE 144*  CALCIUM 10.0   No results for input(s): LABPT, INR in the last 72 hours.  X-Rays:DG Lumbar Spine 2-3 Views  Result Date: 05/23/2021 CLINICAL DATA:  Intraoperative localization for lumbar decompression EXAM: LUMBAR SPINE - 2-3 VIEW COMPARISON:  05/20/2021 FINDINGS: Initial film demonstrates needles in the posterior soft tissues at the L5-S1 level and as well as the S1-2 level. Subsequent film demonstrates surgical retractors and instruments at the L5-S1 level. Third film shows instruments at similar levels. IMPRESSION: Intraoperative localization at L5-S1. Electronically Signed   By: Alcide Clever M.D.   On: 05/23/2021 11:03   DG Lumbar Spine 2-3 Views  Result Date: 05/20/2021 CLINICAL DATA:  Preop for lumbar surgery. History of sciatica and right leg pain. EXAM: LUMBAR SPINE - 2-3 VIEW COMPARISON:  None. FINDINGS: Standing AP and lateral views of the lumbar spine obtained. There are 5 lumbar type vertebra. Vertebral body heights are normal. Disc space narrowing and endplate spurring at L1-L2. Mild additional disc space narrowing throughout. Mild lower lumbar facet hypertrophy. No evidence of fracture or focal bone lesion. The sacroiliac joints are congruent. IMPRESSION: Mild multilevel degenerative disc disease most prominent at L1-L2. Mild lower lumbar facet hypertrophy. Electronically Signed   By: Narda Rutherford M.D.   On: 05/20/2021 23:18    EKG: Orders placed or performed in visit on 05/20/21   EKG 12-Lead   EKG 12-Lead   EKG 12-Lead     Hospital Course: Patient was admitted to [facility] and taken to the OR and underwent the above state procedure without complications.  Patient tolerated the procedure well and was later transferred to the recovery room and then to the orthopaedic floor for postoperative care.  They were given PO and IV analgesics for pain control following their surgery.  They were given 24 hours of postoperative antibiotics.   PT was  consulted postop to assist with mobility and transfers.  The patient was allowed to be WBAT with therapy and was taught back precautions. Discharge planning was consulted to help with postop disposition and equipment needs.  Patient had a good night on the evening of surgery and started to get up OOB with therapy on day one. Patient was seen in rounds and was ready to go home on day one.  They were given discharge instructions and dressing directions.  They were instructed on when to follow up in the office with Dr. Shelle Iron.   Diet: Regular diet Activity:WBAT, Lspine precautions Follow-up:in 10-14 days Disposition - Home Discharged Condition: good   Discharge Instructions     Call MD / Call 911   Complete by: As directed    If you experience chest pain or shortness of breath, CALL 911 and be transported to the hospital emergency room.  If you develope a fever above 101 F, pus (white drainage) or increased drainage or redness at the wound, or calf pain, call your surgeon's office.   Constipation Prevention   Complete by: As directed    Drink plenty of fluids.  Prune juice may be helpful.  You may use a stool softener, such as Colace (over the counter) 100 mg twice a day.  Use MiraLax (over the counter) for constipation as needed.   Diet - low sodium heart healthy   Complete by: As directed    Increase activity slowly as tolerated   Complete by: As directed    Post-operative opioid taper instructions:   Complete by: As directed    POST-OPERATIVE OPIOID TAPER INSTRUCTIONS: It is important to wean off of your opioid medication as soon as possible. If you do not need pain medication after your surgery it is ok to stop day one. Opioids include: Codeine, Hydrocodone(Norco, Vicodin), Oxycodone(Percocet, oxycontin) and hydromorphone amongst others.  Long term and even short term use of opiods can cause: Increased pain response Dependence Constipation Depression Respiratory depression And more.   Withdrawal symptoms can include Flu like symptoms Nausea, vomiting And more Techniques to manage these symptoms Hydrate well Eat regular healthy meals Stay active Use relaxation techniques(deep breathing, meditating, yoga) Do Not substitute Alcohol to help with tapering If you have been on opioids for less than two weeks and do not have pain than it is ok to stop all together.  Plan to wean off of opioids This plan should start within one week post op of your joint replacement. Maintain the same interval or time between taking each dose and first decrease the dose.  Cut the total daily intake of opioids by one tablet each day Next start to increase the time between doses. The last dose that should be eliminated is the evening dose.         Allergies as of 05/24/2021   No Known Allergies      Medication List     STOP taking these medications    aspirin EC 81 MG tablet   naproxen 500 MG tablet Commonly known as: NAPROSYN   rosuvastatin 20 MG tablet Commonly known as: CRESTOR       TAKE these medications    calcium carbonate 500 MG chewable tablet Commonly known as: TUMS - dosed in mg elemental calcium Chew 1 tablet by mouth daily as needed for indigestion or heartburn.   candesartan-hydrochlorothiazide 16-12.5 MG tablet Commonly known as: ATACAND HCT Take 1 tablet by mouth daily.   docusate sodium 100 MG capsule Commonly known as: Colace Take 1 capsule (100 mg total) by mouth 2 (two) times daily as needed for mild constipation.   melatonin 5 MG Tabs Take 5 mg by mouth at bedtime as needed (sleep).   multivitamin capsule Take 1 capsule by mouth daily.   oxyCODONE 5 MG immediate release tablet Commonly known as: Oxy IR/ROXICODONE Take 1-2 tablets (5-10 mg total) by mouth every 4 (four) hours as needed for severe pain.   polyethylene glycol 17 g packet Commonly known as: MIRALAX / GLYCOLAX Take 17 g by mouth daily.   sildenafil 20 MG tablet Commonly  known as: REVATIO Take 20 mg by mouth daily as needed (ED).        Follow-up Information     Jene Every, MD Follow up in 2 week(s).   Specialty: Orthopedic Surgery Contact information: [address] [place] [address] [place] [address] [phone]  Signed: Andrez Grime PA-C Orthopaedic Surgery 05/24/2021, 11:13 AM

## 2021-05-24 NOTE — Evaluation (Signed)
Occupational Therapy Evaluation Patient Details Name: Tony Dominguez MRN: [id number] DOB: 1955/12/19 Today's Date: 05/24/2021    History of Present Illness Pt is a 65 y/o male who presents s/p L5-S1 laminectomy/decompression on 05/23/2021. PMH significant for   Clinical Impression   Pt admitted for procedure listed above. PTA pt reported that he was independent with all ADL's and IADL's, including kayaking and road biking. At the time of the evaluation pt continued to demonstrate independence with no assist needed. Pt was educated on compensatory strategies for ADL's to ensure he is following his precautions. He was able to demonstrate all techniques with no difficulties and recall all 3 precautions. Pt does not need OT services at this time and Acute OT will sign off.    Follow Up Recommendations  No OT follow up;Supervision - Intermittent    Equipment Recommendations  None recommended by OT    Recommendations for Other Services       Precautions / Restrictions Precautions Precautions: Back Precaution Booklet Issued: Yes (comment) Restrictions Weight Bearing Restrictions: No      Mobility Bed Mobility               General bed mobility comments: Sitting EOB on arrival.    Transfers Overall transfer level: Modified independent Equipment used: None             General transfer comment: Pt stood from bed x2, standard toilet x2, and standard chair x1, no difficulties or assist needed.    Balance Overall balance assessment: No apparent balance deficits (not formally assessed)                                         ADL either performed or assessed with clinical judgement   ADL Overall ADL's : Modified independent;At baseline                                       General ADL Comments: Pt demonstrated and simulated, dressing, bathing, grooming, and toileting this session with no difficulties. Educated on compensatory strategies  and demonstrated the new techniques.     Vision Baseline Vision/History: No visual deficits Patient Visual Report: No change from baseline Vision Assessment?: No apparent visual deficits     Perception Perception Perception Tested?: No   Praxis Praxis Praxis tested?: Not tested    Pertinent Vitals/Pain Pain Assessment: No/denies pain     Hand Dominance Right   Extremity/Trunk Assessment Upper Extremity Assessment Upper Extremity Assessment: Overall WFL for tasks assessed   Lower Extremity Assessment Lower Extremity Assessment: Defer to PT evaluation   Cervical / Trunk Assessment Cervical / Trunk Assessment: Other exceptions;Normal Cervical / Trunk Exceptions: s/p Lumbar decompression   Communication Communication Communication: No difficulties   Cognition Arousal/Alertness: Awake/alert Behavior During Therapy: WFL for tasks assessed/performed Overall Cognitive Status: Within Functional Limits for tasks assessed                                     General Comments  No complaints, VSS    Exercises     Shoulder Instructions      Home Living Family/patient expects to be discharged to:: Private residence Living Arrangements: Spouse/significant other Available Help at Discharge: Family;Available PRN/intermittently Type of Home: House  Home Access: Stairs to enter     Home Layout: Two level Alternate Level Stairs-Number of Steps: 1 flight   Bathroom Shower/Tub: Producer, television/film/video: Standard Bathroom Accessibility: Yes How Accessible: Accessible via walker Home Equipment: None          Prior Functioning/Environment Level of Independence: Independent                 OT Problem List: Decreased strength;Decreased range of motion      OT Treatment/Interventions:      OT Goals(Current goals can be found in the care plan section) Acute Rehab OT Goals Patient Stated Goal: To go kayaking again OT Goal Formulation: All  assessment and education complete, DC therapy Time For Goal Achievement: 05/24/21 Potential to Achieve Goals: Good  OT Frequency:     Barriers to D/C:            Co-evaluation              AM-PAC OT "6 Clicks" Daily Activity     Outcome Measure Help from another person eating meals?: None Help from another person taking care of personal grooming?: None Help from another person toileting, which includes using toliet, bedpan, or urinal?: None Help from another person bathing (including washing, rinsing, drying)?: None Help from another person to put on and taking off regular upper body clothing?: None Help from another person to put on and taking off regular lower body clothing?: None 6 Click Score: 24   End of Session Nurse Communication: Mobility status  Activity Tolerance: Patient tolerated treatment well Patient left: in bed;with call bell/phone within reach  OT Visit Diagnosis: Muscle weakness (generalized) (M62.81)                Time: 4132-4401 OT Time Calculation (min): 22 min Charges:  OT General Charges $OT Visit: 1 Visit OT Evaluation $OT Eval Low Complexity: 1 Low  Izaia Say H., OTR/L Acute Rehabilitation  Mordche Hedglin Elane Blair [place]ina 05/24/2021, 9:42 AM

## 2021-05-24 NOTE — Progress Notes (Signed)
Patient alert and oriented, voiding adequately, MAE well with no difficulty. Incision area cdi with no s/s of infection. Patient discharged home per order. Patient and spouse stated understanding of discharge instructions given. Patient has an appointment with Dr. Shelle Iron

## 2021-05-24 NOTE — Evaluation (Signed)
Physical Therapy Evaluation and Discharge Patient Details Name: Tony Dominguez MRN: [id number] DOB: 09-17-1956 Today's Date: 05/24/2021   History of Present Illness  Pt is a 65 y/o male who presents s/p L5-S1 laminectomy/decompression with excision of synovial cyst on 05/23/2021. No significant PMH noted.   Clinical Impression  Patient evaluated by Physical Therapy with no further acute PT needs identified. All education has been completed and the patient has no further questions. Pt was able to demonstrate transfers and ambulation with gross modified independence. Pt was educated on precautions, positioning recommendations, appropriate activity progression, and car transfer. See below for any follow-up Physical Therapy or equipment needs. PT is signing off. Thank you for this referral.     Follow Up Recommendations No PT follow up;Supervision for mobility/OOB    Equipment Recommendations  None recommended by PT    Recommendations for Other Services       Precautions / Restrictions Precautions Precautions: Fall;Back Precaution Booklet Issued: Yes (comment) Precaution Comments: Reviewed handout and pt was cued for precautions during functional mobility. Required Braces or Orthoses:  ("no brace needed" order) Restrictions Weight Bearing Restrictions: No      Mobility  Bed Mobility Overal bed mobility: Modified Independent             General bed mobility comments: HOB flat and rails lowered to simulate home environment. No assist to log roll. Light cues for optimal log roll technique.    Transfers Overall transfer level: Modified independent Equipment used: None             General transfer comment: No assist to power-up to full standing position. Good posture.  Ambulation/Gait Ambulation/Gait assistance: Modified independent (Device/Increase time) Gait Distance (Feet): 400 Feet Assistive device: None Gait Pattern/deviations: Step-through pattern;Trunk  flexed Gait velocity: Decreased Gait velocity interpretation: 1.31 - 2.62 ft/sec, indicative of limited community ambulator General Gait Details: VC's for improved posture.  Stairs Stairs: Yes Stairs assistance: Modified independent (Device/Increase time) Stair Management: One rail Left;Alternating pattern;Forwards Number of Stairs: 10 General stair comments: VC's for general safety. No assist required. Overall good sequencing.  Wheelchair Mobility    Modified Rankin (Stroke Patients Only)       Balance Overall balance assessment: No apparent balance deficits (not formally assessed)                                           Pertinent Vitals/Pain Pain Assessment: Faces Faces Pain Scale: Hurts little more Pain Location: Back/incision site Pain Descriptors / Indicators: Operative site guarding;Discomfort Pain Intervention(s): Limited activity within patient's tolerance;Monitored during session;Repositioned    Home Living Family/patient expects to be discharged to:: Private residence Living Arrangements: Spouse/significant other Available Help at Discharge: Family;Available 24 hours/day Type of Home: House Home Access: Stairs to enter   [employer] of Steps: 3-4 Home Layout: Two level;Bed/bath upstairs Home Equipment: None      Prior Function Level of Independence: Independent         Comments: Pt very active, kayaks, rides trails on bike, etc     Hand Dominance   Dominant Hand: Right    Extremity/Trunk Assessment   Upper Extremity Assessment Upper Extremity Assessment: Defer to OT evaluation    Lower Extremity Assessment Lower Extremity Assessment: Generalized weakness (Mild weakness consistent with pre-op diagnosis)    Cervical / Trunk Assessment Cervical / Trunk Assessment: Other exceptions Cervical / Trunk Exceptions: s/p surgery  Communication   Communication: No difficulties  Cognition Arousal/Alertness:  Awake/alert Behavior During Therapy: WFL for tasks assessed/performed Overall Cognitive Status: Within Functional Limits for tasks assessed                                        General Comments General comments (skin integrity, edema, etc.): No complaints, VSS    Exercises     Assessment/Plan    PT Assessment Patent does not need any further PT services  PT Problem List         PT Treatment Interventions      PT Goals (Current goals can be found in the Care Plan section)  Acute Rehab PT Goals Patient Stated Goal: To go kayaking again PT Goal Formulation: All assessment and education complete, DC therapy    Frequency     Barriers to discharge        Co-evaluation               AM-PAC PT "6 Clicks" Mobility  Outcome Measure Help needed turning from your back to your side while in a flat bed without using bedrails?: None Help needed moving from lying on your back to sitting on the side of a flat bed without using bedrails?: None Help needed moving to and from a bed to a chair (including a wheelchair)?: None Help needed standing up from a chair using your arms (e.g., wheelchair or bedside chair)?: None Help needed to walk in hospital room?: None Help needed climbing 3-5 steps with a railing? : None 6 Click Score: 24    End of Session Equipment Utilized During Treatment: Gait belt Activity Tolerance: Patient tolerated treatment well Patient left: in bed;with call bell/phone within reach Nurse Communication: Mobility status PT Visit Diagnosis: Unsteadiness on feet (R26.81);Pain Pain - part of body:  (back)    Time: 9163-8466 PT Time Calculation (min) (ACUTE ONLY): 17 min   Charges:   PT Evaluation $PT Eval Low Complexity: 1 Low          Conni Slipper, PT, DPT Acute Rehabilitation Services Pager: [phone] Office: [phone]   Marylynn Pearson 05/24/2021, 10:22 AM

## 2022-11-19 ENCOUNTER — Encounter: Payer: Self-pay | Admitting: Diagnostic Neuroimaging

## 2022-11-19 ENCOUNTER — Ambulatory Visit: Payer: Medicare Other | Admitting: Diagnostic Neuroimaging

## 2022-11-19 VITALS — BP 147/82 | HR 53 | Ht 68.0 in | Wt 188.0 lb

## 2022-11-19 DIAGNOSIS — M5416 Radiculopathy, lumbar region: Secondary | ICD-10-CM

## 2022-11-19 DIAGNOSIS — M48061 Spinal stenosis, lumbar region without neurogenic claudication: Secondary | ICD-10-CM

## 2022-11-19 DIAGNOSIS — R200 Anesthesia of skin: Secondary | ICD-10-CM

## 2022-11-19 NOTE — Patient Instructions (Signed)
  NUMBNESS IN FEET (likely sequelae from prior lumbar radiculopathies (s/p surgery in 2021)) - check B12 level (neuropathy eval) - monitor symptoms; plateau for now; if worsening in future, then consider repeat MRI lumbar spine and add; neuropathy testing

## 2022-11-19 NOTE — Progress Notes (Signed)
[facility]  PATIENT: Tony Dominguez DOB: 1956-05-27  REFERRING CLINICIAN: Antony Contras, MD HISTORY FROM: PATIENT REASON FOR VISIT: NEW CONSULT   HISTORICAL  CHIEF COMPLAINT:  Chief Complaint  Patient presents with   New Patient (Initial Visit)    Pt alone, rm 7, 05/2020 had lower back surgery for sciatic nerve pain. After surgery continued to have numbness. Md stated could take 1-1.5 yr for the numbness to go away. Pt states still there. He says bilateral feet. Worse in morning.     HISTORY OF PRESENT ILLNESS:   67 year old male here for evaluation of numbness and tingling.  Had low back pain with numbness and tingling rating down the right leg.  Underwent lumbar spine surgery in July 2021.  Symptoms improved but have not fully resolved.  Also having some slight numbness in the left foot.  No major weakness or pain.  No problems in hands.   REVIEW OF SYSTEMS: Full 14 system review of systems performed and negative with exception of: as per HPI.  ALLERGIES: Allergies  Allergen Reactions   Atorvastatin     lipitor    HOME MEDICATIONS: Outpatient Medications Prior to Visit  Medication Sig Dispense Refill   calcium carbonate (TUMS - DOSED IN MG ELEMENTAL CALCIUM) 500 MG chewable tablet Chew 1 tablet by mouth daily as needed for indigestion or heartburn.     candesartan-hydrochlorothiazide (ATACAND HCT) 16-12.5 MG tablet Take 1 tablet by mouth daily.     Multiple Vitamin (MULTIVITAMIN) capsule Take 1 capsule by mouth daily.     naproxen (EC NAPROSYN) 500 MG EC tablet Take 500 mg by mouth 2 (two) times daily as needed.     Psyllium (METAMUCIL) 28.3 % POWD Take by mouth as needed.     rosuvastatin (CRESTOR) 20 MG tablet Take 20 mg by mouth daily.     sildenafil (REVATIO) 20 MG tablet Take 20 mg by mouth daily as needed (ED).     docusate sodium (COLACE) 100 MG capsule Take 1 capsule (100 mg total) by mouth 2 (two) times daily as needed for mild constipation.  30 capsule 1   Magnesium 400 MG CAPS Take by mouth.     melatonin 5 MG TABS Take 5 mg by mouth at bedtime as needed (sleep).     oxyCODONE (OXY IR/ROXICODONE) 5 MG immediate release tablet Take 1-2 tablets (5-10 mg total) by mouth every 4 (four) hours as needed for severe pain. 40 tablet 0   polyethylene glycol (MIRALAX / GLYCOLAX) 17 g packet Take 17 g by mouth daily. 14 each 0   No facility-administered medications prior to visit.    PAST MEDICAL HISTORY: Past Medical History:  Diagnosis Date   Hypercholesteremia     PAST SURGICAL HISTORY: Past Surgical History:  Procedure Laterality Date   LUMBAR LAMINECTOMY/DECOMPRESSION MICRODISCECTOMY Right 05/23/2021   Procedure: Microlumbar decompression Lumbar five-Sacral one right with excision of synovial cyst;  Surgeon: Susa Day, MD;  Location: [place];  Service: Orthopedics;  Laterality: Right;   TONSILLECTOMY     VASECTOMY      FAMILY HISTORY: No family history on file.  SOCIAL HISTORY: Social History   Socioeconomic History   Marital status: Married    Spouse name: Not on file   Number of children: Not on file   Years of education: Not on file   Highest education level: Not on file  Occupational History   Not on file  Tobacco Use   Smoking status: Former  Types: Cigarettes   Smokeless tobacco: Never  Vaping Use   Vaping Use: Never used  Substance and Sexual Activity   Alcohol use: Yes    Comment: occasional   Drug use: No   Sexual activity: Yes  Other Topics Concern   Not on file  Social History Narrative   Not on file   Social Determinants of Health   Financial Resource Strain: Not on file  Food Insecurity: Not on file  Transportation Needs: Not on file  Physical Activity: Not on file  Stress: Not on file  Social Connections: Not on file  Intimate Partner Violence: Not on file     PHYSICAL EXAM  GENERAL EXAM/CONSTITUTIONAL: Vitals:  Vitals:   11/19/22 0906  BP: (!) 147/82  Pulse: (!) 53   Weight: 188 lb (85.3 kg)  Height: 5\' 8"  (1.727 m)   Body mass index is 28.59 kg/m. Wt Readings from Last 3 Encounters:  11/19/22 188 lb (85.3 kg)  05/23/21 183 lb 10.3 oz (83.3 kg)  05/20/21 183 lb 11.2 oz (83.3 kg)   Patient is in no distress; well developed, nourished and groomed; neck is supple  CARDIOVASCULAR: Examination of carotid arteries is normal; no carotid bruits Regular rate and rhythm, no murmurs Examination of peripheral vascular system by observation and palpation is normal  EYES: Ophthalmoscopic exam of optic discs and posterior segments is normal; no papilledema or hemorrhages No results found.  MUSCULOSKELETAL: Gait, strength, tone, movements noted in Neurologic exam below  NEUROLOGIC: MENTAL STATUS:      No data to display         awake, alert, oriented to person, place and time recent and remote memory intact normal attention and concentration language fluent, comprehension intact, naming intact fund of knowledge appropriate  CRANIAL NERVE:  2nd - no papilledema on fundoscopic exam 2nd, 3rd, 4th, 6th - pupils equal and reactive to light, visual fields full to confrontation, extraocular muscles intact, no nystagmus 5th - facial sensation symmetric 7th - facial strength symmetric 8th - hearing intact 9th - palate elevates symmetrically, uvula midline 11th - shoulder shrug symmetric 12th - tongue protrusion midline  MOTOR:  normal bulk and tone, full strength in the BUE, BLE  SENSORY:  normal and symmetric to light touch, pinprick, temperature, vibration; except DECR PP IN TOES (LEFT WORSE THAN RIGHT); DECR VIB AT TOES  COORDINATION:  finger-nose-finger, fine finger movements normal  REFLEXES:  deep tendon reflexes 1+ and symmetric  GAIT/STATION:  narrow based gait     DIAGNOSTIC DATA (LABS, IMAGING, TESTING) - I reviewed patient records, labs, notes, testing and imaging myself where available.  Lab Results  Component Value Date    WBC 5.1 05/20/2021   HGB 14.6 05/20/2021   HCT 43.1 05/20/2021   MCV 93.3 05/20/2021   PLT 176 05/20/2021      Component Value Date/Time   NA 139 05/24/2021 0237   K 4.5 05/24/2021 0237   CL 103 05/24/2021 0237   CO2 27 05/24/2021 0237   GLUCOSE 144 (H) 05/24/2021 0237   BUN 13 05/24/2021 0237   CREATININE 0.90 05/24/2021 0237   CALCIUM 10.0 05/24/2021 0237   PROT 6.7 02/16/2010 0001   ALBUMIN 4.1 02/16/2010 0001   AST 24 02/16/2010 0001   ALT 35 02/16/2010 0001   ALKPHOS 47 02/16/2010 0001   BILITOT 0.6 02/16/2010 0001   GFRNONAA >60 05/24/2021 0237   GFRAA  02/16/2010 0001    >60        The eGFR  has been calculated using the MDRD equation. This calculation has not been validated in all clinical situations. eGFR's persistently <60 mL/min signify possible Chronic Kidney Disease.   No results found for: "CHOL", "HDL", "LDLCALC", "LDLDIRECT", "TRIG", "CHOLHDL" No results found for: "HGBA1C" No results found for: "VITAMINB12" No results found for: "TSH"    ASSESSMENT AND PLAN  67 y.o. year old male here with:   Dx:  1. Numbness in feet   2. Lumbar radiculopathy   3. Spinal stenosis of lumbar region without neurogenic claudication      PLAN:   NUMBNESS IN FEET (likely sequelae from prior lumbar radiculopathies (s/p surgery in 2021)) - check B12 level (neuropathy eval) - monitor symptoms; plateau for now; if worsening in future, then consider repeat MRI lumbar spine and add; neuropathy testing  Orders Placed This Encounter  Procedures   Vitamin B12   Return for pending if symptoms worsen or fail to improve, return to PCP.    Penni Bombard, MD 6/44/0347, 4:25 AM Certified in Neurology, Neurophysiology and Neuroimaging  [facility] [address] [place], [place] [address] [phone]

## 2022-11-20 ENCOUNTER — Encounter: Payer: Self-pay | Admitting: Diagnostic Neuroimaging

## 2022-11-20 LAB — VITAMIN B12: Vitamin B-12: 510 pg/mL (ref 232–1245)

## 2024-07-12 ENCOUNTER — Ambulatory Visit: Admitting: Diagnostic Neuroimaging

## 2024-07-12 ENCOUNTER — Encounter: Payer: Self-pay | Admitting: Diagnostic Neuroimaging

## 2024-07-12 VITALS — BP 138/79 | HR 54 | Ht 68.0 in | Wt 188.0 lb

## 2024-07-12 DIAGNOSIS — R252 Cramp and spasm: Secondary | ICD-10-CM | POA: Diagnosis not present

## 2024-07-12 DIAGNOSIS — R200 Anesthesia of skin: Secondary | ICD-10-CM | POA: Diagnosis not present

## 2024-07-12 NOTE — Progress Notes (Signed)
 [facility]  PATIENT: Tony Dominguez DOB: 28-Oct-1956  REFERRING CLINICIAN: Seabron Lenis, MD HISTORY FROM: PATIENT REASON FOR VISIT: follow up   HISTORICAL  CHIEF COMPLAINT:  Chief Complaint  Patient presents with   Neurologic Problem    Pt alone, rm 6. He was seen here Jan 2024 for concerns of neuropathy in feet. He states that he had overall been doing ok but at the time he made the appt symptoms had worsened. Since then he started taking magnesium  for cramping which helped that and numbness and tingling had lessened. The neuropathy comes and goes.    HISTORY OF PRESENT ILLNESS:   UPDATE (07/12/24, VRP): Since last visit, doing well. Numbness in feet improving. Had some cramps in feet and hands with exercise. Now much improved with OTC magnesium  glycinate. Active with pickleball, sweimming and weights.   PRIOR HPI (11/19/22, VRP): 68 year old male here for evaluation of numbness and tingling.  Had low back pain with numbness and tingling rating down the right leg.  Underwent lumbar spine surgery in July 2021.  Symptoms improved but have not fully resolved.  Also having some slight numbness in the left foot.  No major weakness or pain.  No problems in hands.   REVIEW OF SYSTEMS: Full 14 system review of systems performed and negative with exception of: as per HPI.  ALLERGIES: Allergies  Allergen Reactions   Atorvastatin     lipitor    HOME MEDICATIONS: Outpatient Medications Prior to Visit  Medication Sig Dispense Refill   aspirin 81 MG chewable tablet Chew 81 mg by mouth daily.     calcium carbonate (TUMS - DOSED IN MG ELEMENTAL CALCIUM) 500 MG chewable tablet Chew 1 tablet by mouth daily as needed for indigestion or heartburn.     candesartan -hydrochlorothiazide  (ATACAND  HCT) 16-12.5 MG tablet Take 1 tablet by mouth daily.     MAGNESIUM  GLYCINATE ADVANCED PO Take 275 mg by mouth daily.     naproxen (EC NAPROSYN) 500 MG EC tablet Take 500 mg by mouth 2  (two) times daily as needed.     Psyllium (METAMUCIL) 28.3 % POWD Take by mouth as needed.     rosuvastatin (CRESTOR) 20 MG tablet Take 20 mg by mouth daily.     sildenafil (REVATIO) 20 MG tablet Take 20 mg by mouth daily as needed (ED).     Multiple Vitamin (MULTIVITAMIN) capsule Take 1 capsule by mouth daily.     No facility-administered medications prior to visit.    PAST MEDICAL HISTORY: Past Medical History:  Diagnosis Date   Anxiety    BPH (benign prostatic hyperplasia)    Chronic GERD    ED (erectile dysfunction)    Hypercholesteremia    Hypercholesterolemia    Nephrolithiasis    Neuropathy    Osteoarthritis cervical spine    Pre-diabetes     PAST SURGICAL HISTORY: Past Surgical History:  Procedure Laterality Date   LUMBAR LAMINECTOMY/DECOMPRESSION MICRODISCECTOMY Right 05/23/2021   Procedure: Microlumbar decompression Lumbar five-Sacral one right with excision of synovial cyst;  Surgeon: Duwayne Purchase, MD;  Location: [facility];  Service: Orthopedics;  Laterality: Right;   TONSILLECTOMY     VASECTOMY      FAMILY HISTORY: No family history on file.  SOCIAL HISTORY: Social History   Socioeconomic History   Marital status: Married    Spouse name: Not on file   Number of children: Not on file   Years of education: Not on file   Highest education level: Not on  file  Occupational History   Not on file  Tobacco Use   Smoking status: Former    Types: Cigarettes   Smokeless tobacco: Never  Vaping Use   Vaping status: Never Used  Substance and Sexual Activity   Alcohol use: Yes    Comment: occasional   Drug use: No   Sexual activity: Yes  Other Topics Concern   Not on file  Social History Narrative   Not on file   Social Drivers of Health   Financial Resource Strain: Not on file  Food Insecurity: Not on file  Transportation Needs: Not on file  Physical Activity: Not on file  Stress: Not on file  Social Connections: Not on file  Intimate Partner  Violence: Not on file     PHYSICAL EXAM  GENERAL EXAM/CONSTITUTIONAL: Vitals:  Vitals:   07/12/24 0938  BP: 138/79  Pulse: (!) 54  Weight: 188 lb (85.3 kg)  Height: 5' 8 (1.727 m)   Body mass index is 28.59 kg/m. Wt Readings from Last 3 Encounters:  07/12/24 188 lb (85.3 kg)  11/19/22 188 lb (85.3 kg)  05/23/21 183 lb 10.3 oz (83.3 kg)   Patient is in no distress; well developed, nourished and groomed; neck is supple  CARDIOVASCULAR: Examination of carotid arteries is normal; no carotid bruits Regular rate and rhythm, no murmurs Examination of peripheral vascular system by observation and palpation is normal  EYES: Ophthalmoscopic exam of optic discs and posterior segments is normal; no papilledema or hemorrhages No results found.  MUSCULOSKELETAL: Gait, strength, tone, movements noted in Neurologic exam below  NEUROLOGIC: MENTAL STATUS:      No data to display         awake, alert, oriented to person, place and time recent and remote memory intact normal attention and concentration language fluent, comprehension intact, naming intact fund of knowledge appropriate  CRANIAL NERVE:  2nd - no papilledema on fundoscopic exam 2nd, 3rd, 4th, 6th - pupils equal and reactive to light, visual fields full to confrontation, extraocular muscles intact, no nystagmus 5th - facial sensation symmetric 7th - facial strength symmetric 8th - hearing intact 9th - palate elevates symmetrically, uvula midline 11th - shoulder shrug symmetric 12th - tongue protrusion midline  MOTOR:  normal bulk and tone, full strength in the BUE, BLE  SENSORY:  normal and symmetric to light touch, pinprick, temperature, vibration  COORDINATION:  finger-nose-finger, fine finger movements normal  REFLEXES:  deep tendon reflexes 1+ and symmetric; ABSENT AT ANKLES  GAIT/STATION:  narrow based gait     DIAGNOSTIC DATA (LABS, IMAGING, TESTING) - I reviewed patient records, labs,  notes, testing and imaging myself where available.  Lab Results  Component Value Date   WBC 5.1 05/20/2021   HGB 14.6 05/20/2021   HCT 43.1 05/20/2021   MCV 93.3 05/20/2021   PLT 176 05/20/2021      Component Value Date/Time   NA 139 05/24/2021 0237   K 4.5 05/24/2021 0237   CL 103 05/24/2021 0237   CO2 27 05/24/2021 0237   GLUCOSE 144 (H) 05/24/2021 0237   BUN 13 05/24/2021 0237   CREATININE 0.90 05/24/2021 0237   CALCIUM 10.0 05/24/2021 0237   PROT 6.7 02/16/2010 0001   ALBUMIN 4.1 02/16/2010 0001   AST 24 02/16/2010 0001   ALT 35 02/16/2010 0001   ALKPHOS 47 02/16/2010 0001   BILITOT 0.6 02/16/2010 0001   GFRNONAA >60 05/24/2021 0237   GFRAA  02/16/2010 0001    >60  The eGFR has been calculated using the MDRD equation. This calculation has not been validated in all clinical situations. eGFR's persistently <60 mL/min signify possible Chronic Kidney Disease.   No results found for: CHOL, HDL, LDLCALC, LDLDIRECT, TRIG, CHOLHDL No results found for: YHAJ8R Lab Results  Component Value Date   VITAMINB12 510 11/19/2022   No results found for: TSH    ASSESSMENT AND PLAN  68 y.o. year old male here with:   Dx:  1. Numbness in feet   2. Exercise-induced leg cramps    PLAN:  EXERTIONAL CRAMPS IN TOES / HANDS (since ~2023 in hands; since ~2021 in feet; triggered by cold and exertion) - improved with magnesium ; monitor for now   MILD NUMBNESS IN FEET (sequelae from prior lumbar radiculopathies (s/p surgery in 2021), improved; could represent mild idiopathic neuropathy) - stable; neuro exam normal; monitor for now  Return for return to PCP.    EDUARD FABIENE HANLON, MD 07/12/2024, 10:49 AM Certified in Neurology, Neurophysiology and Neuroimaging  [facility] [address] [place], [place] [address] [phone]

## 2024-07-12 NOTE — Patient Instructions (Signed)
  EXERTIONAL CRAMPS IN TOES / HANDS (since ~2023 in hands; since ~2021 in feet; triggered by cold and exertion) - improved with magnesium ; monitor for now

## 2024-07-18 ENCOUNTER — Ambulatory Visit: Attending: Cardiovascular Disease | Admitting: Cardiovascular Disease

## 2024-07-18 VITALS — BP 123/76 | HR 52 | Ht 68.0 in | Wt 187.0 lb

## 2024-07-18 DIAGNOSIS — I10 Essential (primary) hypertension: Secondary | ICD-10-CM | POA: Diagnosis not present

## 2024-07-18 DIAGNOSIS — E782 Mixed hyperlipidemia: Secondary | ICD-10-CM | POA: Diagnosis not present

## 2024-07-18 DIAGNOSIS — M48061 Spinal stenosis, lumbar region without neurogenic claudication: Secondary | ICD-10-CM

## 2024-07-18 DIAGNOSIS — R0789 Other chest pain: Secondary | ICD-10-CM

## 2024-07-18 DIAGNOSIS — Z8249 Family history of ischemic heart disease and other diseases of the circulatory system: Secondary | ICD-10-CM | POA: Insufficient documentation

## 2024-07-18 DIAGNOSIS — R931 Abnormal findings on diagnostic imaging of heart and coronary circulation: Secondary | ICD-10-CM | POA: Insufficient documentation

## 2024-07-18 DIAGNOSIS — E785 Hyperlipidemia, unspecified: Secondary | ICD-10-CM | POA: Insufficient documentation

## 2024-07-18 DIAGNOSIS — R0609 Other forms of dyspnea: Secondary | ICD-10-CM | POA: Insufficient documentation

## 2024-07-18 MED ORDER — ROSUVASTATIN CALCIUM 40 MG PO TABS: 40.0000 mg | ORAL_TABLET | Freq: Every day | ORAL | 3 refills | Status: AC

## 2024-07-18 NOTE — Assessment & Plan Note (Signed)
 Coronary calcium  score performed 01/27/2017 was 108 distributed through all 3 coronary arteries.  Given his dyspnea I am going to repeat this.

## 2024-07-18 NOTE — Addendum Note (Signed)
 Addended by: MELIDA ROLIN HERO on: 07/18/2024 09:05 AM   Modules accepted: Orders

## 2024-07-18 NOTE — Progress Notes (Signed)
 07/18/2024 Tony Dominguez   Nov 09, 1955  [id number]  Primary Physician Seabron Lenis, MD Primary Cardiologist: Dorn JINNY Lesches MD [facility] [place], [place]  HPI:  Tony Dominguez is a 68 y.o. 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 Seabron, his PCP, for cardiovascular valuation because of dyspnea.  His risk factors include borderline diabetes, treated hypertension, hyperlipidemia and family history of the father who died of myocardial infarction at age 45.  He is never had a heart attack or stroke.  He denies chest pain but does get dyspnea when hiking but not when doing ordinary exercise.  He is fairly active, [place] to the [employer] times a [place] and does swimming, rides his bike and lifts weights.  He and his wife Will who accompanies him today are fairly active travelers having been to [place], [place] etc.   Current Meds  Medication Sig   aspirin 81 MG chewable tablet Chew 81 mg by mouth daily.   calcium  carbonate (TUMS - DOSED IN MG ELEMENTAL CALCIUM ) 500 MG chewable tablet Chew 1 tablet by mouth daily as needed for indigestion or heartburn.   candesartan -hydrochlorothiazide  (ATACAND  HCT) 16-12.5 MG tablet Take 1 tablet by mouth daily.   MAGNESIUM  GLYCINATE ADVANCED PO Take 275 mg by mouth daily.   naproxen (EC NAPROSYN) 500 MG EC tablet Take 500 mg by mouth 2 (two) times daily as needed.   Psyllium (METAMUCIL) 28.3 % POWD Take by mouth as needed.   rosuvastatin  (CRESTOR ) 40 MG tablet Take 1 tablet (40 mg total) by mouth daily.   sildenafil (REVATIO) 20 MG tablet Take 20 mg by mouth daily as needed (ED).   [DISCONTINUED] rosuvastatin  (CRESTOR ) 20 MG tablet Take 20 mg by mouth daily.     Allergies  Allergen Reactions   Atorvastatin     lipitor    Social History   Socioeconomic History   Marital status: Married    Spouse name: Not on file   Number of  children: Not on file   Years of education: Not on file   Highest education level: Not on file  Occupational History   Not on file  Tobacco Use   Smoking status: Former    Types: Cigarettes   Smokeless tobacco: Never  Vaping Use   Vaping status: Never Used  Substance and Sexual Activity   Alcohol use: Yes    Comment: occasional   Drug use: No   Sexual activity: Yes  Other Topics Concern   Not on file  Social History Narrative   Not on file   Social Drivers of Health   Financial Resource Strain: Not on file  Food Insecurity: Not on file  Transportation Needs: Not on file  Physical Activity: Not on file  Stress: Not on file  Social Connections: Not on file  Intimate Partner Violence: Not on file     Review of Systems: General: negative for chills, fever, night sweats or weight changes.  Cardiovascular: negative for chest pain, dyspnea on exertion, edema, orthopnea, palpitations, paroxysmal nocturnal dyspnea or shortness of breath Dermatological: negative for rash Respiratory: negative for cough or wheezing Urologic: negative for hematuria Abdominal: negative for nausea, vomiting, diarrhea, bright red blood per rectum, melena, or hematemesis Neurologic: negative for visual changes, syncope, or dizziness All other systems reviewed and are otherwise negative except as noted above.    Blood pressure 123/76, pulse (!) 52,  height 5' 8 (1.727 m), weight 187 lb (84.8 kg), SpO2 94%.  General appearance: alert and no distress Neck: no adenopathy, no carotid bruit, no JVD, supple, symmetrical, trachea midline, and thyroid not enlarged, symmetric, no tenderness/mass/nodules Lungs: clear to auscultation bilaterally Heart: regular rate and rhythm, S1, S2 normal, no murmur, click, rub or gallop Extremities: extremities normal, atraumatic, no cyanosis or edema Pulses: 2+ and symmetric Skin: Skin color, texture, turgor normal. No rashes or lesions Neurologic: Grossly  normal  EKG EKG Interpretation Date/Time:  Monday July 18 2024 08:35:53 EDT Ventricular Rate:  52 PR Interval:  188 QRS Duration:  92 QT Interval:  434 QTC Calculation: 403 R Axis:   61  Text Interpretation: Sinus bradycardia When compared with ECG of 20-May-2021 11:59, No significant change was found Confirmed by Court Carrier [phone]) on 07/18/2024 8:38:03 AM    ASSESSMENT AND PLAN:   Essential hypertension History of essential hypertension with blood pressure measured today at 123/76.  He is on candesartan /hydrochlorothiazide .  Hyperlipidemia History of hyperlipidemia on rosuvastatin  20 mg a day with lipid profile performed 04/21/2024 revealing total cholesterol 189, LDL 107 and HDL of 60, not at goal for secondary prevention.  I am going to increase his rosuvastatin  from 20 to 40 mg a day.  Will recheck a lipid liver profile in 3 months, LDL goal less than 70.  Family history of heart disease Father had a myocardial infarction at age 73 and passed away as a result.  Dyspnea on exertion Patient is very active, goes to the [employer] times a week, swims, rides bikes and does weights.  He does not get dyspnea when exercising but he does when hiking uphill.  I am going to get a 2D echo to further evaluate as well as a coronary calcium  score.  Elevated coronary artery calcium  score Coronary calcium  score performed 01/27/2017 was 108 distributed through all 3 coronary arteries.  Given his dyspnea I am going to repeat this.     Carrier DOROTHA Court MD FACP,FACC,FAHA, [facility] 07/18/2024 8:58 AM

## 2024-07-18 NOTE — Assessment & Plan Note (Signed)
 History of hyperlipidemia on rosuvastatin  20 mg a day with lipid profile performed 04/21/2024 revealing total cholesterol 189, LDL 107 and HDL of 60, not at goal for secondary prevention.  I am going to increase his rosuvastatin  from 20 to 40 mg a day.  Will recheck a lipid liver profile in 3 months, LDL goal less than 70.

## 2024-07-18 NOTE — Patient Instructions (Addendum)
 Medication Instructions:  Your physician has recommended you make the following change in your medication:  INCREASE: Crestor  40 mg once daily  *If you need a refill on your cardiac medications before your next appointment, please call your pharmacy*  Lab Work: In 3 months: Lipids, LFTs If you have labs (blood work) drawn today and your tests are completely normal, you will receive your results only by: MyChart Message (if you have MyChart) OR A paper copy in the mail If you have any lab test that is abnormal or we need to change your treatment, we will call you to review the results.  Testing/Procedures: Your physician has requested that you have an echocardiogram. Echocardiography is a painless test that uses sound waves to create images of your heart. It provides your doctor with information about the size and shape of your heart and how well your heart's chambers and valves are working. This procedure takes approximately one hour. There are no restrictions for this procedure. Please do NOT wear cologne, perfume, aftershave, or lotions (deodorant is allowed). Please arrive 15 minutes prior to your appointment time.  Please note: We ask at that you not bring children with you during ultrasound (echo/ vascular) testing. Due to room size and safety concerns, children are not allowed in the ultrasound rooms during exams. Our front office staff cannot provide observation of children in our lobby area while testing is being conducted. An adult accompanying a patient to their appointment will only be allowed in the ultrasound room at the discretion of the ultrasound technician under special circumstances. We apologize for any inconvenience.   Follow-Up: At [facility], you and your health needs are our priority.  As part of our continuing mission to provide you with exceptional heart care, our providers are all part of one team.  This team includes your primary Cardiologist (physician) and  Advanced Practice Providers or APPs (Physician Assistants and Nurse Practitioners) who all work together to provide you with the care you need, when you need it.  Your next appointment:   1 year(s)  Provider:   Dorn Lesches, MD

## 2024-07-18 NOTE — Assessment & Plan Note (Signed)
 Patient is very active, goes to the [employer] times a week, swims, rides bikes and does weights.  He does not get dyspnea when exercising but he does when hiking uphill.  I am going to get a 2D echo to further evaluate as well as a coronary calcium  score.

## 2024-07-18 NOTE — Assessment & Plan Note (Signed)
 History of essential hypertension with blood pressure measured today at 123/76.  He is on candesartan /hydrochlorothiazide .

## 2024-07-18 NOTE — Assessment & Plan Note (Signed)
 Father had a myocardial infarction at age 70 and passed away as a result.

## 2024-07-25 ENCOUNTER — Ambulatory Visit: Admitting: Cardiovascular Disease

## 2024-08-25 ENCOUNTER — Ambulatory Visit (HOSPITAL_COMMUNITY)
Admission: RE | Admit: 2024-08-25 | Discharge: 2024-08-25 | Disposition: A | Discharge status: Home / Self Care | Source: Ambulatory Visit | Attending: Internal Medicine | Admitting: Internal Medicine

## 2024-08-25 ENCOUNTER — Ambulatory Visit: Payer: Self-pay | Admitting: Cardiovascular Disease

## 2024-08-25 DIAGNOSIS — R0609 Other forms of dyspnea: Secondary | ICD-10-CM | POA: Insufficient documentation

## 2024-08-25 LAB — ECHOCARDIOGRAM COMPLETE
Area-P 1/2: 3.85 cm2
S' Lateral: 2.94 cm

## 2024-10-17 LAB — LIPID PANEL
Chol/HDL Ratio: 3 ratio (ref 0.0–5.0)
Cholesterol, Total: 165 mg/dL (ref 100–199)
HDL: 55 mg/dL (ref >39)
LDL Chol Calc (NIH): 85 mg/dL (ref 0–99)
Triglycerides: 141 mg/dL (ref 0–149)
VLDL Cholesterol Cal: 25 mg/dL (ref 5–40)

## 2024-10-17 LAB — HEPATIC FUNCTION PANEL
ALT: 34 IU/L (ref 0–44)
AST: 29 IU/L (ref 0–40)
Albumin: 4.6 g/dL (ref 3.9–4.9)
Alkaline Phosphatase: 52 IU/L (ref 47–123)
Bilirubin Total: 0.5 mg/dL (ref 0.0–1.2)
Bilirubin, Direct: 0.17 mg/dL (ref 0.00–0.40)
Total Protein: 6.8 g/dL (ref 6.0–8.5)

## 2024-10-21 MED ORDER — EZETIMIBE 10 MG PO TABS: 10.0000 mg | ORAL_TABLET | Freq: Every day | ORAL | 3 refills | Status: AC

## 2024-10-21 NOTE — Addendum Note (Signed)
 Addended by: JOSHUA ANDREZ PARAS on: 10/21/2024 09:35 AM   Modules accepted: Orders
